# Patient Record
Sex: Male | Born: 1945 | Race: White | Hispanic: No | Marital: Married | Smoking: Former smoker
Health system: Southern US, Community
[De-identification: ages and names within clinical notes are randomized; demographics above are authoritative.]

## PROBLEM LIST (undated history)

## (undated) DIAGNOSIS — E7800 Pure hypercholesterolemia, unspecified: Secondary | ICD-10-CM

## (undated) DIAGNOSIS — R7303 Prediabetes: Secondary | ICD-10-CM

## (undated) DIAGNOSIS — J449 Chronic obstructive pulmonary disease, unspecified: Secondary | ICD-10-CM

## (undated) DIAGNOSIS — H269 Unspecified cataract: Secondary | ICD-10-CM

## (undated) DIAGNOSIS — R3911 Hesitancy of micturition: Secondary | ICD-10-CM

## (undated) DIAGNOSIS — J309 Allergic rhinitis, unspecified: Secondary | ICD-10-CM

## (undated) DIAGNOSIS — D490 Neoplasm of unspecified behavior of digestive system: Secondary | ICD-10-CM

## (undated) DIAGNOSIS — N201 Calculus of ureter: Secondary | ICD-10-CM

## (undated) DIAGNOSIS — K5732 Diverticulitis of large intestine without perforation or abscess without bleeding: Secondary | ICD-10-CM

## (undated) HISTORY — DX: Hesitancy of micturition: R39.11

## (undated) HISTORY — DX: Prediabetes: R73.03

## (undated) HISTORY — PX: TONSILLECTOMY: SUR1361

## (undated) HISTORY — DX: Calculus of ureter: N20.1

## (undated) HISTORY — DX: Unspecified cataract: H26.9

## (undated) HISTORY — DX: Allergic rhinitis, unspecified: J30.9

## (undated) HISTORY — PX: CHOLECYSTECTOMY: SHX55

## (undated) HISTORY — DX: Diverticulitis of large intestine without perforation or abscess without bleeding: K57.32

## (undated) HISTORY — DX: Chronic obstructive pulmonary disease, unspecified: J44.9

## (undated) HISTORY — PX: CATARACT EXTRACTION: SUR2

## (undated) HISTORY — DX: Neoplasm of unspecified behavior of digestive system: D49.0

## (undated) HISTORY — DX: Pure hypercholesterolemia, unspecified: E78.00

---

## 2012-06-14 ENCOUNTER — Ambulatory Visit (INDEPENDENT_AMBULATORY_CARE_PROVIDER_SITE_OTHER): Payer: BC Managed Care – PPO | Admitting: Emergency Medicine

## 2012-06-14 ENCOUNTER — Ambulatory Visit: Payer: BC Managed Care – PPO

## 2012-06-14 VITALS — BP 135/83 | HR 89 | Temp 98.0°F | Resp 18 | Ht 70.0 in | Wt 257.0 lb

## 2012-06-14 DIAGNOSIS — F17200 Nicotine dependence, unspecified, uncomplicated: Secondary | ICD-10-CM

## 2012-06-14 DIAGNOSIS — J3489 Other specified disorders of nose and nasal sinuses: Secondary | ICD-10-CM

## 2012-06-14 DIAGNOSIS — R05 Cough: Secondary | ICD-10-CM

## 2012-06-14 DIAGNOSIS — Z720 Tobacco use: Secondary | ICD-10-CM

## 2012-06-14 DIAGNOSIS — J029 Acute pharyngitis, unspecified: Secondary | ICD-10-CM

## 2012-06-14 LAB — POCT CBC
Granulocyte percent: 80.7 %G — AB (ref 37–80)
MID (cbc): 0.7 (ref 0–0.9)
MPV: 8.8 fL (ref 0–99.8)
POC Granulocyte: 9.8 — AB (ref 2–6.9)
POC LYMPH PERCENT: 13.4 %L (ref 10–50)
POC MID %: 5.9 %M (ref 0–12)
Platelet Count, POC: 277 10*3/uL (ref 142–424)
RBC: 5.41 M/uL (ref 4.69–6.13)
RDW, POC: 14.1 %

## 2012-06-14 IMAGING — CR DG CHEST 2V
2 series · 2 of 2 positions shown · non-contrast
Comparison: None.

CLINICAL DATA: Cough

CHEST - 2 VIEW

[PA]
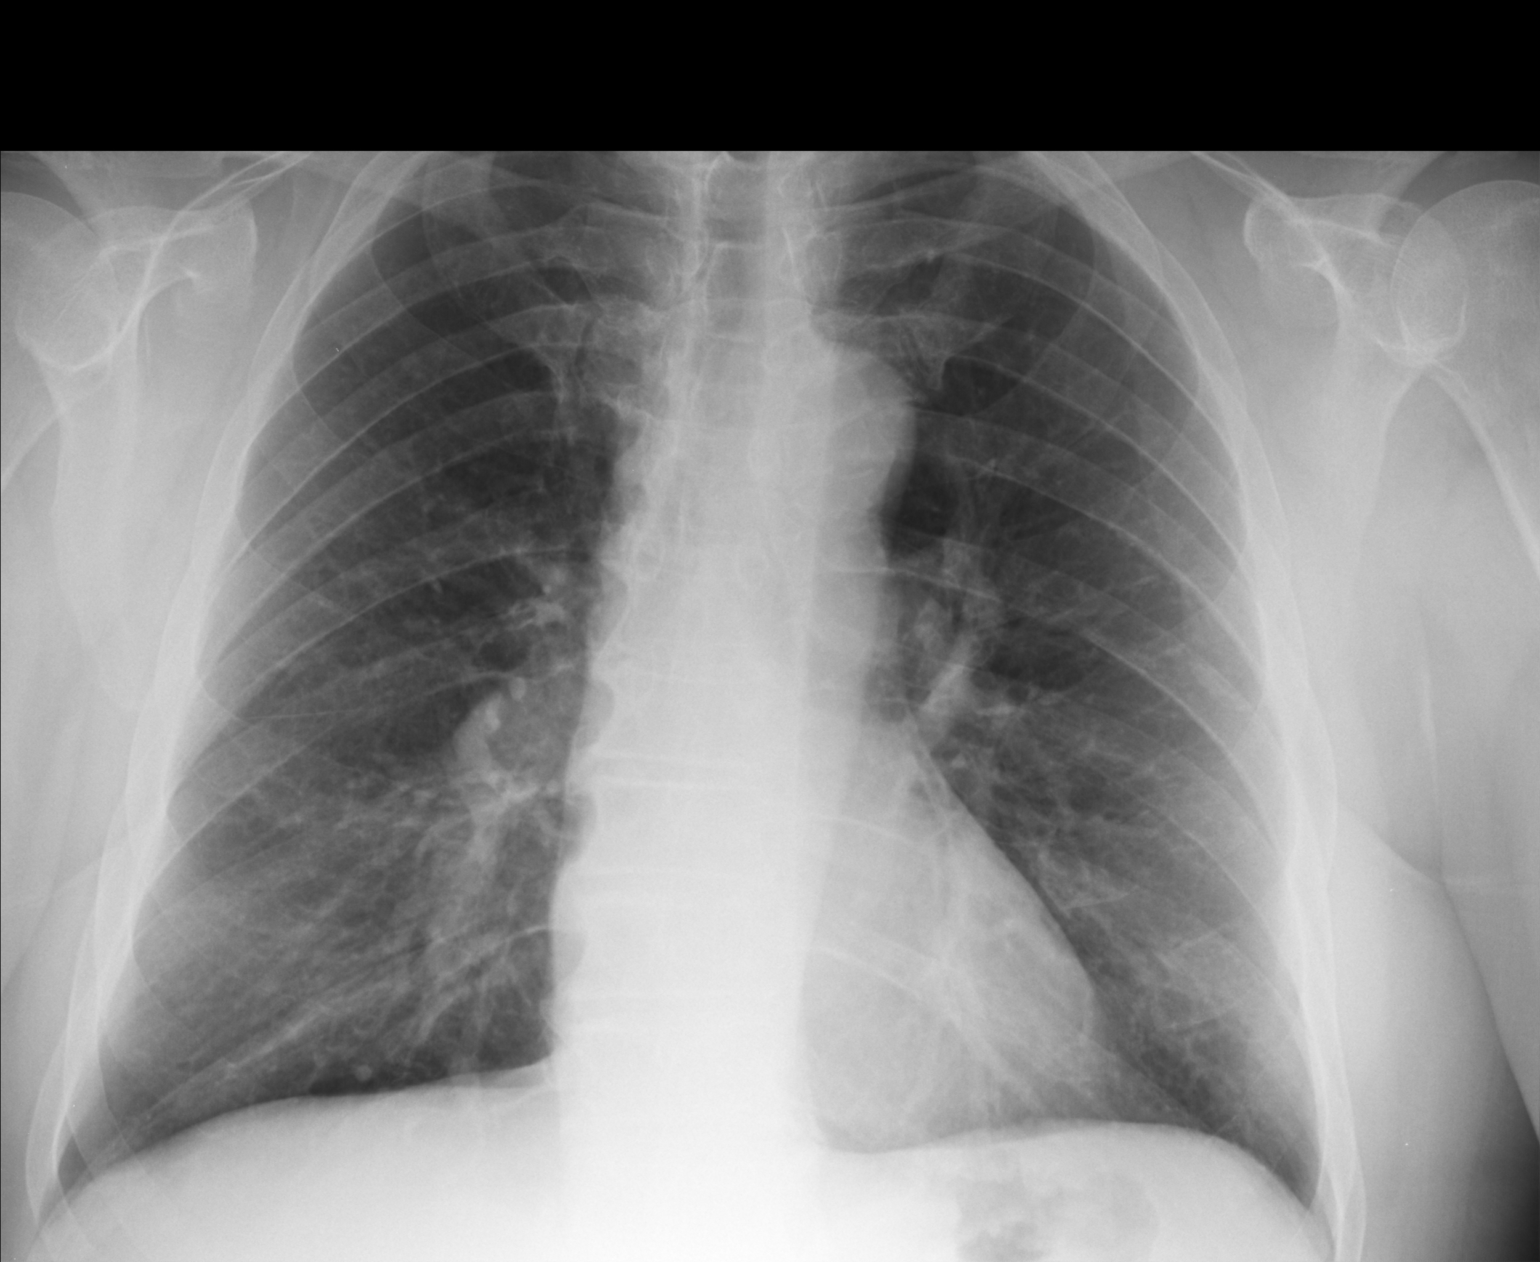

[lateral]
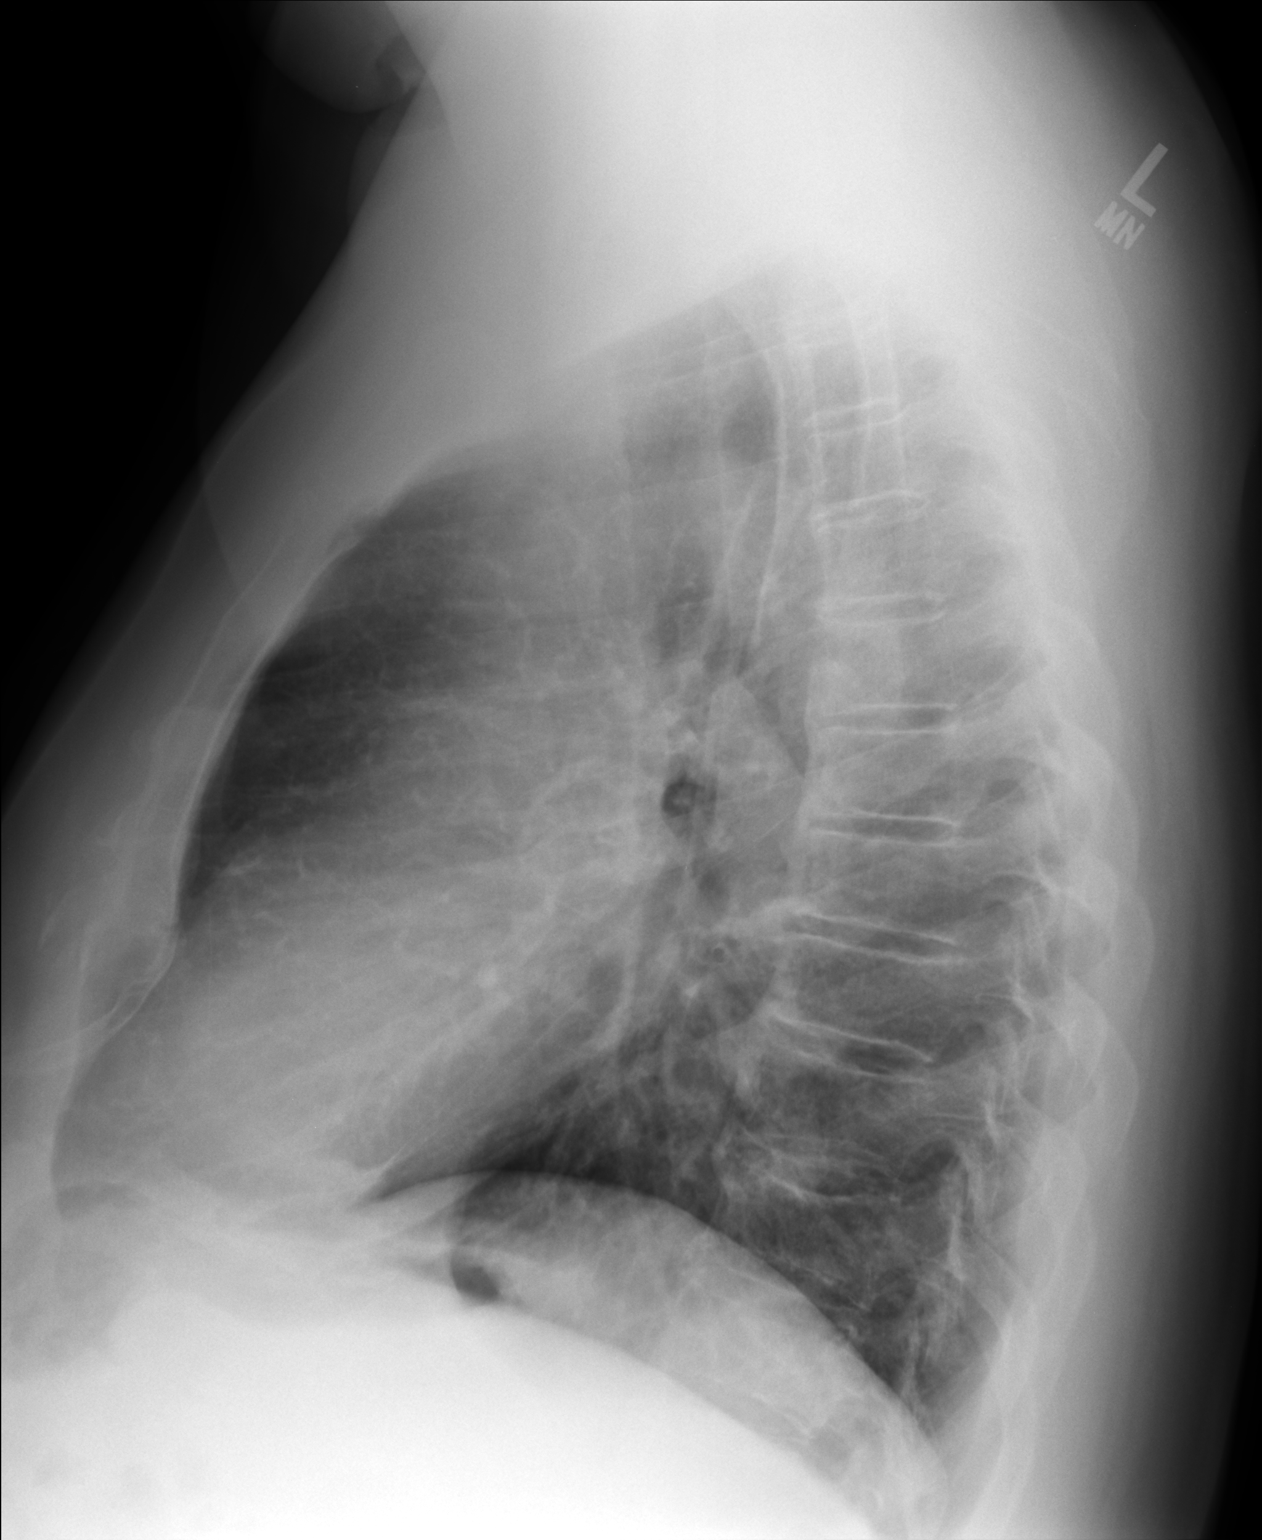

[2 of 2 positions shown; findings below may reference images not displayed]

FINDINGS: Lungs are essentially clear. No pleural effusion or
pneumothorax.

Cardiomediastinal silhouette is within normal limits.

Degenerative changes of the visualized thoracolumbar spine.
IMPRESSION: No evidence of acute cardiopulmonary disease.

## 2012-06-14 MED ORDER — HYDROCOD POLST-CHLORPHEN POLST 10-8 MG/5ML PO LQCR: 5.0000 mL | Freq: Two times a day (BID) | ORAL | Status: DC | PRN

## 2012-06-14 MED ORDER — GUAIFENESIN ER 1200 MG PO TB12: 1.0000 | ORAL_TABLET | Freq: Two times a day (BID) | ORAL | Status: DC | PRN

## 2012-06-14 MED ORDER — LEVOFLOXACIN 500 MG PO TABS: 500.0000 mg | ORAL_TABLET | Freq: Every day | ORAL | Status: DC

## 2012-06-14 NOTE — Patient Instructions (Addendum)
Take all of the Levaquin as prescribed.  Use Mucinex twice daily.  Use Tussionex for cough at night if needed (do not use this during the day as it may make you sleepy).  Use an over the counter allergy medicine (Claritin/Allegra/Zyrtec - your choice!) for runny nose and post-nasal drainage.  If you are worsening or not improving, come back in.

## 2012-06-14 NOTE — Progress Notes (Signed)
Subjective:    Patient ID: Paul Ibarra, male    DOB: 1945/09/21, 66 y.o.   MRN: [id number]  HPI  Paul Ibarra is a 66 yr old male with approximately 1 month of cough productive of "tons of mucus".  He works at the [employer] and went to a doctor there who gave him an antibiotic for bronchitis several weeks ago.  He does not remember what medication he was given, but states he finished the full course.  He states his symptoms are not going away.  He has now developed some sore throat and rhinorrhea.  He denies fever or chills.  His appetite is normal.  No nausea or vomiting.  Endorses some diarrhea that he attributes to Mucinex.  Has been treating symptoms with Mucinex, Dayquil, and Nyquil with little relief.  He currently smokes about 1ppd and has done so since he was 66 yrs old.        Review of Systems  Constitutional: Negative for fever, chills, appetite change and unexpected weight change.  HENT: Positive for congestion, sore throat and rhinorrhea. Negative for ear pain.   Respiratory: Positive for cough, shortness of breath and wheezing.   Cardiovascular: Negative.   Gastrointestinal: Negative.   Musculoskeletal: Positive for arthralgias (bilateral knees).  Skin: Negative.   Neurological: Negative for dizziness, syncope, light-headedness and headaches.  All other systems reviewed and are negative.       Objective:   Physical Exam  Vitals reviewed. Constitutional: He is oriented to person, place, and time. He appears well-developed and well-nourished. No distress.  HENT:  Head: Normocephalic and atraumatic.  Right Ear: Tympanic membrane and ear canal normal.  Left Ear: Tympanic membrane and ear canal normal.  Nose: Nose normal. Right sinus exhibits no maxillary sinus tenderness and no frontal sinus tenderness. Left sinus exhibits no maxillary sinus tenderness and no frontal sinus tenderness.  Mouth/Throat: Uvula is midline, oropharynx is clear and moist and mucous membranes are  normal.  Neck: Neck supple.  Cardiovascular: Normal rate, regular rhythm, normal heart sounds and intact distal pulses.   Pulmonary/Chest: No accessory muscle usage. Not tachypneic. No respiratory distress. He has no decreased breath sounds. He has wheezes (throughout). He has rhonchi in the right middle field and the left middle field. He has no rales.       Upper airway congestion as well  Lymphadenopathy:    He has no cervical adenopathy.  Neurological: He is alert and oriented to person, place, and time.  Skin: Skin is warm and dry.  Psychiatric: He has a normal mood and affect. His behavior is normal.     Filed Vitals:   06/14/12 1813  BP: 135/83  Pulse: 89  Temp: 98 F (36.7 C)  Resp: 18     Results for orders placed in visit on 06/14/12  POCT CBC      Component Value Range   WBC 12.1 (*) 4.6 - 10.2 K/uL   Lymph, poc 1.6  0.6 - 3.4   POC LYMPH PERCENT 13.4  10 - 50 %L   MID (cbc) 0.7  0 - 0.9   POC MID % 5.9  0 - 12 %M   POC Granulocyte 9.8 (*) 2 - 6.9   Granulocyte percent 80.7 (*) 37 - 80 %G   RBC 5.41  4.69 - 6.13 M/uL   Hemoglobin 15.9  14.1 - 18.1 g/dL   HCT, POC 13.0  86.5 - 53.7 %   MCV 95.9  80 - 97 fL  MCH, POC 29.4  27 - 31.2 pg   MCHC 30.6 (*) 31.8 - 35.4 g/dL   RDW, POC 16.1     Platelet Count, POC 277  142 - 424 K/uL   MPV 8.8  0 - 99.8 fL     [facility] reading (PRIMARY) by  Dr. Dareen Piano - no evidence of pneumonia       Assessment & Plan:   1. Cough  POCT CBC, DG Chest 2 View, levofloxacin (LEVAQUIN) 500 MG tablet, Guaifenesin (MUCINEX MAXIMUM STRENGTH) 1200 MG TB12, chlorpheniramine-HYDROcodone (TUSSIONEX PENNKINETIC ER) 10-8 MG/5ML LQCR  2. Tobacco use  DG Chest 2 View  3. Rhinorrhea    4. Sore throat      Paul Ibarra is a 66 yr old male with one month history of productive cough.  Suspect that this is an acute exacerbation of chronic bronchitis given his smoking history.  White count is slightly elevated today, but he is afebrile.  CXR shows  no evidence of a pneumonia.  He does not remember the name of his previous antibiotic, but suspect that it was azithromycin.  Will treat with 7 days of Levaquin.  Encouraged continuation of Mucinex BID and OTC allergy medication for relief of rhinorrhea/post-nasal drainage.  Tussionex for cough at night if needed.  Discussed RTC precautions.  Briefly discussed smoking cessation.

## 2012-09-22 NOTE — Progress Notes (Signed)
Reviewed and agree.

## 2013-08-17 ENCOUNTER — Ambulatory Visit: Payer: BC Managed Care – PPO

## 2013-08-17 ENCOUNTER — Ambulatory Visit (INDEPENDENT_AMBULATORY_CARE_PROVIDER_SITE_OTHER): Payer: BC Managed Care – PPO | Admitting: Family Medicine

## 2013-08-17 ENCOUNTER — Other Ambulatory Visit: Payer: Self-pay | Admitting: Family Medicine

## 2013-08-17 VITALS — BP 120/80 | HR 86 | Temp 98.2°F | Resp 18 | Ht 69.75 in | Wt 253.0 lb

## 2013-08-17 DIAGNOSIS — R05 Cough: Secondary | ICD-10-CM

## 2013-08-17 DIAGNOSIS — R059 Cough, unspecified: Secondary | ICD-10-CM

## 2013-08-17 DIAGNOSIS — E669 Obesity, unspecified: Secondary | ICD-10-CM | POA: Insufficient documentation

## 2013-08-17 DIAGNOSIS — R062 Wheezing: Secondary | ICD-10-CM

## 2013-08-17 DIAGNOSIS — R0602 Shortness of breath: Secondary | ICD-10-CM

## 2013-08-17 DIAGNOSIS — Z720 Tobacco use: Secondary | ICD-10-CM | POA: Insufficient documentation

## 2013-08-17 DIAGNOSIS — F17200 Nicotine dependence, unspecified, uncomplicated: Secondary | ICD-10-CM

## 2013-08-17 IMAGING — CR DG CHEST 2V
2 series · 2 of 2 positions shown · non-contrast
Comparison: [DATE]

CLINICAL DATA: Wheezing and shortness of breath.

EXAM:
CHEST  2 VIEW

[lateral]
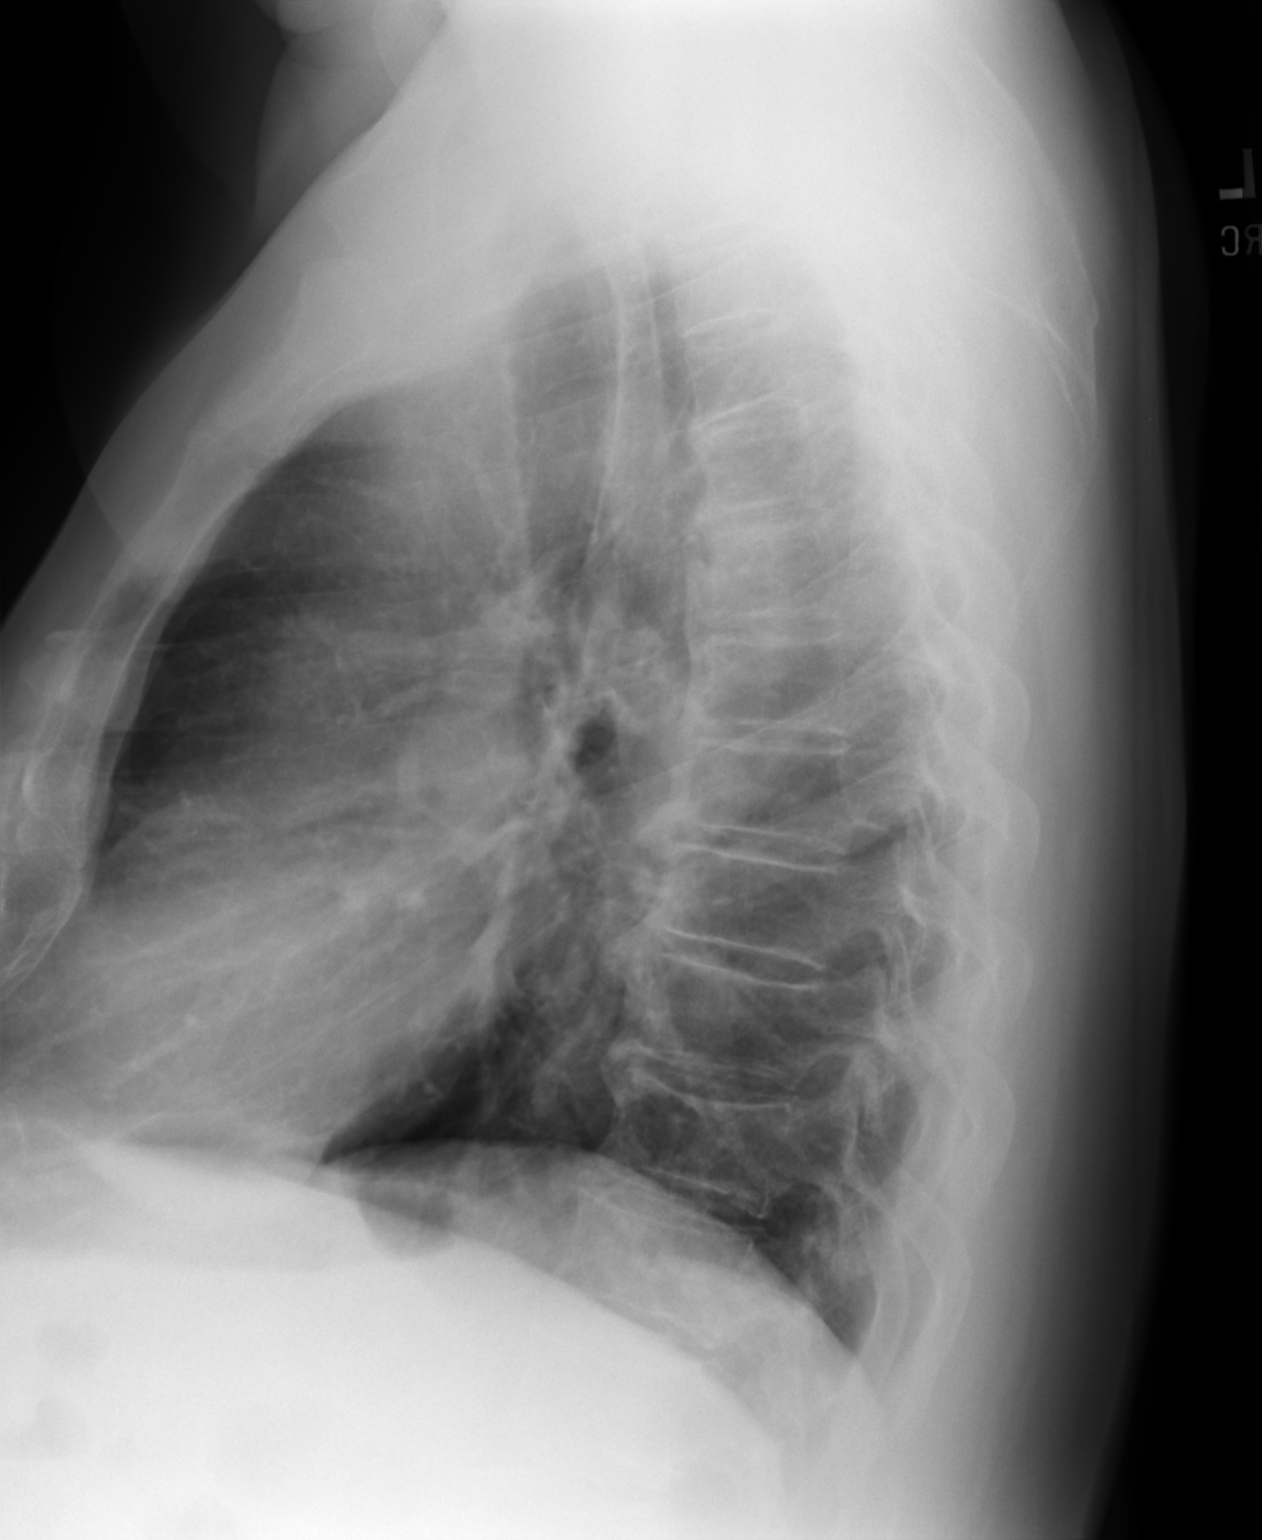

[PA]
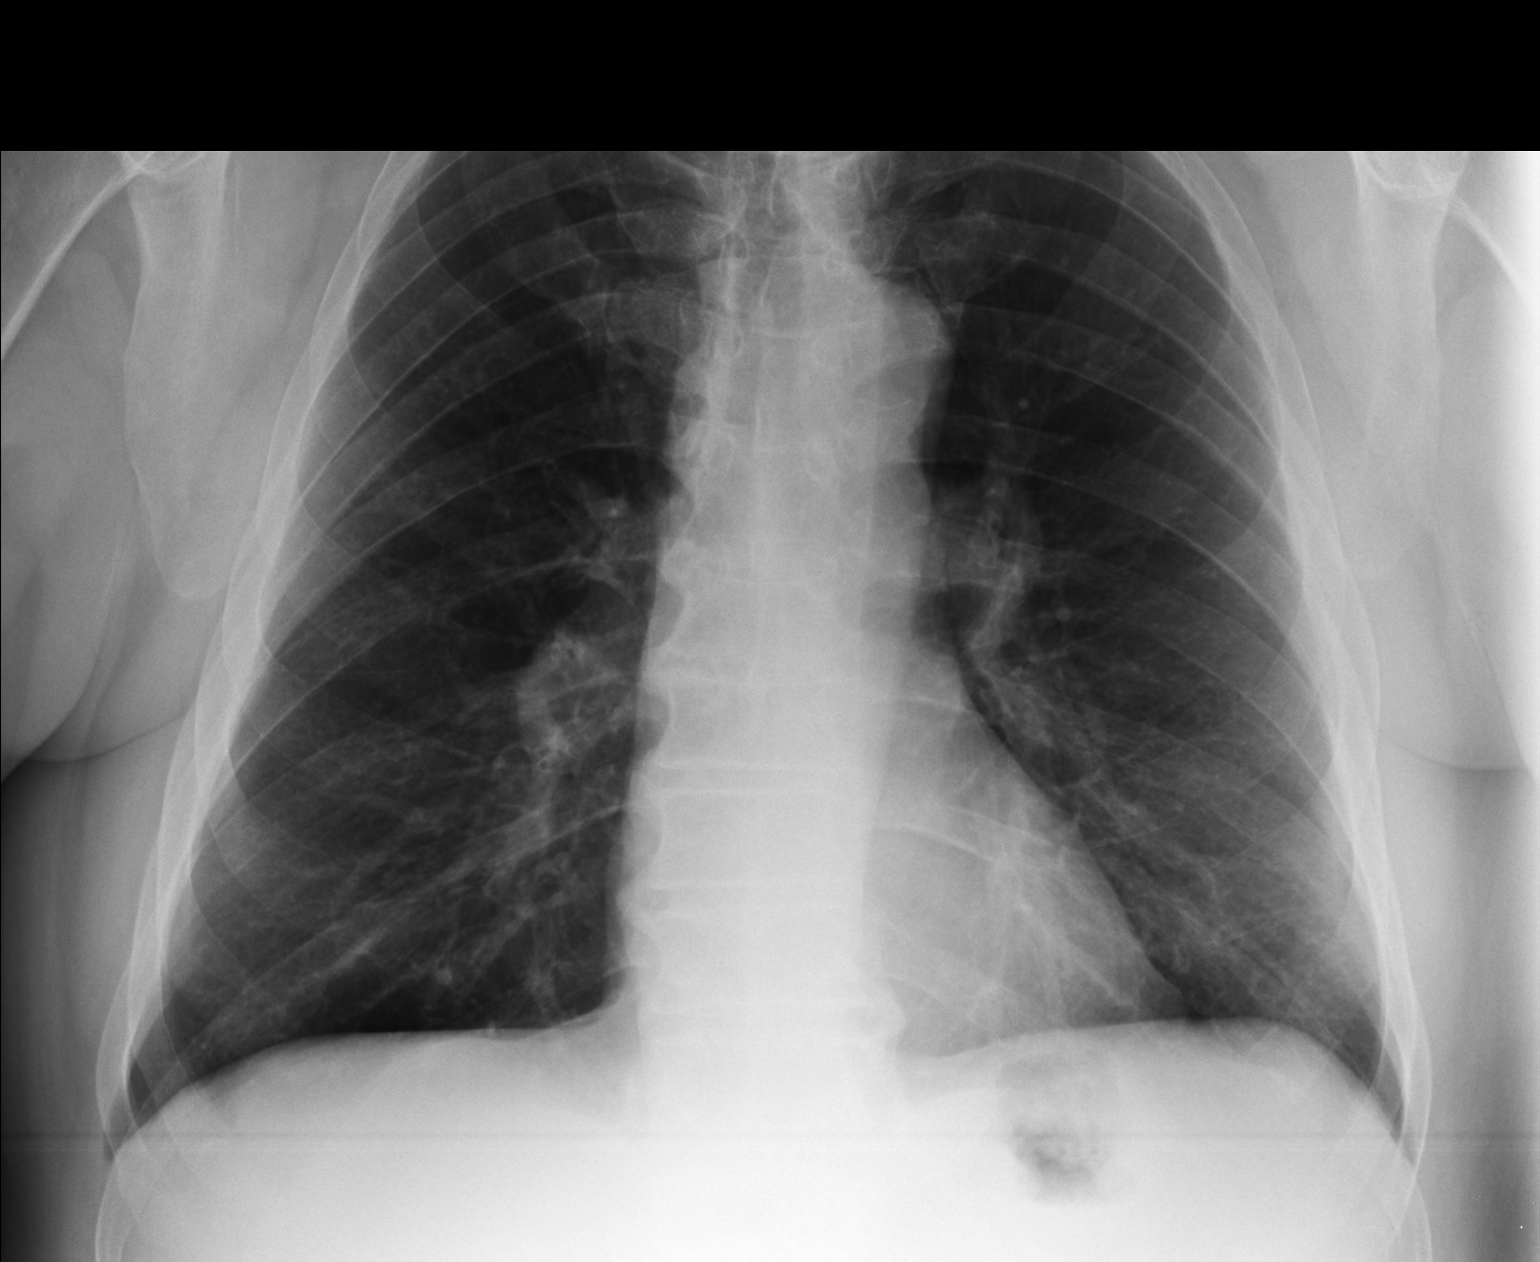

[2 of 2 positions shown; findings below may reference images not displayed]

FINDINGS: Normal heart size. No pleural effusion or edema. No airspace
consolidation identified. Chronic bronchitic changes are noted
bilaterally. Review of the visualized osseous structures is
unremarkable.
IMPRESSION: 1. Bronchitic changes.
2. No acute cardiopulmonary abnormality.

## 2013-08-17 MED ORDER — BENZONATATE 100 MG PO CAPS: 100.0000 mg | ORAL_CAPSULE | Freq: Three times a day (TID) | ORAL | Status: DC | PRN

## 2013-08-17 MED ORDER — ALBUTEROL SULFATE (2.5 MG/3ML) 0.083% IN NEBU
2.5000 mg | INHALATION_SOLUTION | Freq: Once | RESPIRATORY_TRACT | Status: AC
  Administered 2013-08-17: 2.5 mg via RESPIRATORY_TRACT

## 2013-08-17 MED ORDER — ALBUTEROL SULFATE HFA 108 (90 BASE) MCG/ACT IN AERS
2.0000 | INHALATION_SPRAY | Freq: Four times a day (QID) | RESPIRATORY_TRACT | Status: DC | PRN
Start: 2013-08-17 — End: 2015-10-19

## 2013-08-17 MED ORDER — DOXYCYCLINE HYCLATE 100 MG PO CAPS: 100.0000 mg | ORAL_CAPSULE | Freq: Two times a day (BID) | ORAL | Status: DC

## 2013-08-17 MED ORDER — PREDNISONE 20 MG PO TABS: ORAL_TABLET | ORAL | Status: DC

## 2013-08-17 NOTE — Progress Notes (Signed)
[facility] [address] [place] [address] [phone]  Date:  08/17/2013   Name:  Paul Ibarra   DOB:  [birth year]   MRN:  [id number]  PCP:  No primary provider on file.    Chief Complaint: Cough and Shortness of Breath   History of Present Illness:  Paul Ibarra is a 68 y.o. very pleasant male patient who presents with the following:  Here today because of SOB that he noted yesterday.   He notes that a couple of months ago he had a lot of nasal congestion and cough.  He saw his PCP and was treated with levaquin and mucinex D; he thinks he was given this treatment in early December.  He got better but he continues to have "mucus from his sinuses."    Yesterday he noted chest tightness and wheezing. It got quite cold yesterday. 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  He is also coughing again- this seemed to get worse yesterday.    Admits that he vomited on Tuesday evening after over- eating; he vomited a lot of mucus.  Otherwise he has not had abdominal pain or GI symptoms.   He has not noted a fever  He is taking OTC claritin and some sort of OTC cough medication.    He has not definitely been told that he has COPD- he does have albuterol at home.  He used it yesterday; it did not seem to help much.   He saw pulmonary a couple of years ago.  Per his report he had spirometry at that time that looked ok  He has been smoking a PPD for about 50 years.   Otherwise he has a history of nail fungus, no cardiac problems that he is aware of No CP  There are no active problems to display for this patient.   History reviewed. No pertinent past medical history.  Past Surgical History  Procedure Laterality Date  . Cholecystectomy      History  Substance Use Topics  . Smoking status: Current Every Day Smoker -- 1.00 packs/day for 50 years  . Smokeless tobacco: Not on file  . Alcohol Use: Yes     History reviewed. No pertinent family history.  Allergies  Allergen Reactions  . Sulfa Antibiotics     Medication list has been reviewed and updated.  No current outpatient prescriptions on file prior to visit.   No current facility-administered medications on file prior to visit.    Review of Systems:  As per HPI- otherwise negative.   Physical Examination: Filed Vitals:   08/17/13 1050  BP: 120/80  Pulse: 86  Temp: 98.2 F (36.8 C)  Resp: 18   Filed Vitals:   08/17/13 1050  Height: 5' 9.75" (1.772 m)  Weight: 253 lb (114.76 kg)   Body mass index is 36.55 kg/(m^2). Ideal Body Weight: Weight in (lb) to have BMI = 25: 172.6  GEN: WDWN, NAD, Non-toxic, A & O x 3, obese, has appearance of long- term heavy smoker HEENT: Atraumatic, Normocephalic. Neck supple. No masses, No LAD.  Bilateral TM wnl, oropharynx normal.  PEERL,EOMI.   Ears and Nose: No external deformity. CV: RRR, No M/G/R. No JVD. No thrill. No extra heart sounds. PULM: CTA B, no wheezes, crackles, rhonchi. No retractions. No resp. distress. No accessory muscle use. ABD: S, NT, ND. No rebound. No HSM. EXTR: No c/c/e NEURO  Normal gait.  PSYCH: Normally interactive. Conversant. Not depressed or anxious appearing.  Calm demeanor.   Given albuterol neb: sat to 93% and wheezing somewhat better  [facility] reading (PRIMARY) by  Dr. Lorelei Pont. CXR: likely COPD.  Borderline lung size.   Suspect increased marking left lower lobe is breast shadow   CHEST 2 VIEW  COMPARISON: 06/14/2012  FINDINGS: Normal heart size. No pleural effusion or edema. No airspace consolidation identified. Chronic bronchitic changes are noted bilaterally. Review of the visualized osseous structures is unremarkable.  IMPRESSION: 1. Bronchitic changes. 2. No acute cardiopulmonary abnormality.  Assessment and Plan: SOB (shortness of breath) - Plan: albuterol (PROVENTIL) (2.5 MG/3ML) 0.083% nebulizer solution 2.5 mg, doxycycline  (VIBRAMYCIN) 100 MG capsule, predniSONE (DELTASONE) 20 MG tablet  Wheezing - Plan: DG Chest 2 View, albuterol (PROVENTIL) (2.5 MG/3ML) 0.083% nebulizer solution 2.5 mg, albuterol (PROVENTIL HFA;VENTOLIN HFA) 108 (90 BASE) MCG/ACT inhaler  Tobacco abuse  Cough - Plan: benzonatate (TESSALON) 100 MG capsule  Likely COPD exacerbation.  Will treat with prednisone, doxycycline, refilled albuterol, tessalon perles.   Encouraged him to have re- evaluation for COPD when he is well; a medication like spiriva might improve his quality of life.  If not getting better he is to let me know- Sooner if worse.      Signed Lamar Blinks, MD

## 2013-08-17 NOTE — Patient Instructions (Addendum)
Use the prednisone and doxycycline as directed. Use the albuterol as needed for your wheezing.  If you are not better in the next few days please let me know- Sooner if worse.   Use the tessalon perles as needed for cough.    Once you are well, you might want to have spirometry testing to evaluate your lung function. I suspect that you likely have COPD, and a separate inhaler that you use every day may improve your breathing and exercise tolerance   Please think about quitting smoking!

## 2013-08-21 ENCOUNTER — Other Ambulatory Visit: Payer: Self-pay | Admitting: *Deleted

## 2013-08-21 NOTE — Telephone Encounter (Signed)
Opened refill encounter in error.

## 2015-10-19 ENCOUNTER — Ambulatory Visit (INDEPENDENT_AMBULATORY_CARE_PROVIDER_SITE_OTHER): Payer: BLUE CROSS/BLUE SHIELD | Admitting: Physician Assistant

## 2015-10-19 ENCOUNTER — Ambulatory Visit (INDEPENDENT_AMBULATORY_CARE_PROVIDER_SITE_OTHER): Payer: BLUE CROSS/BLUE SHIELD

## 2015-10-19 VITALS — BP 112/80 | HR 109 | Temp 99.5°F | Resp 17 | Ht 70.0 in | Wt 263.4 lb

## 2015-10-19 DIAGNOSIS — R058 Other specified cough: Secondary | ICD-10-CM

## 2015-10-19 DIAGNOSIS — R05 Cough: Secondary | ICD-10-CM

## 2015-10-19 DIAGNOSIS — R0602 Shortness of breath: Secondary | ICD-10-CM | POA: Diagnosis not present

## 2015-10-19 DIAGNOSIS — R062 Wheezing: Secondary | ICD-10-CM

## 2015-10-19 IMAGING — CR DG CHEST 2V
2 series · 2 of 2 positions shown · non-contrast
Comparison: [DATE]

CLINICAL DATA: Cough 2-3 days, hoarseness

EXAM:
CHEST  2 VIEW

[PA]
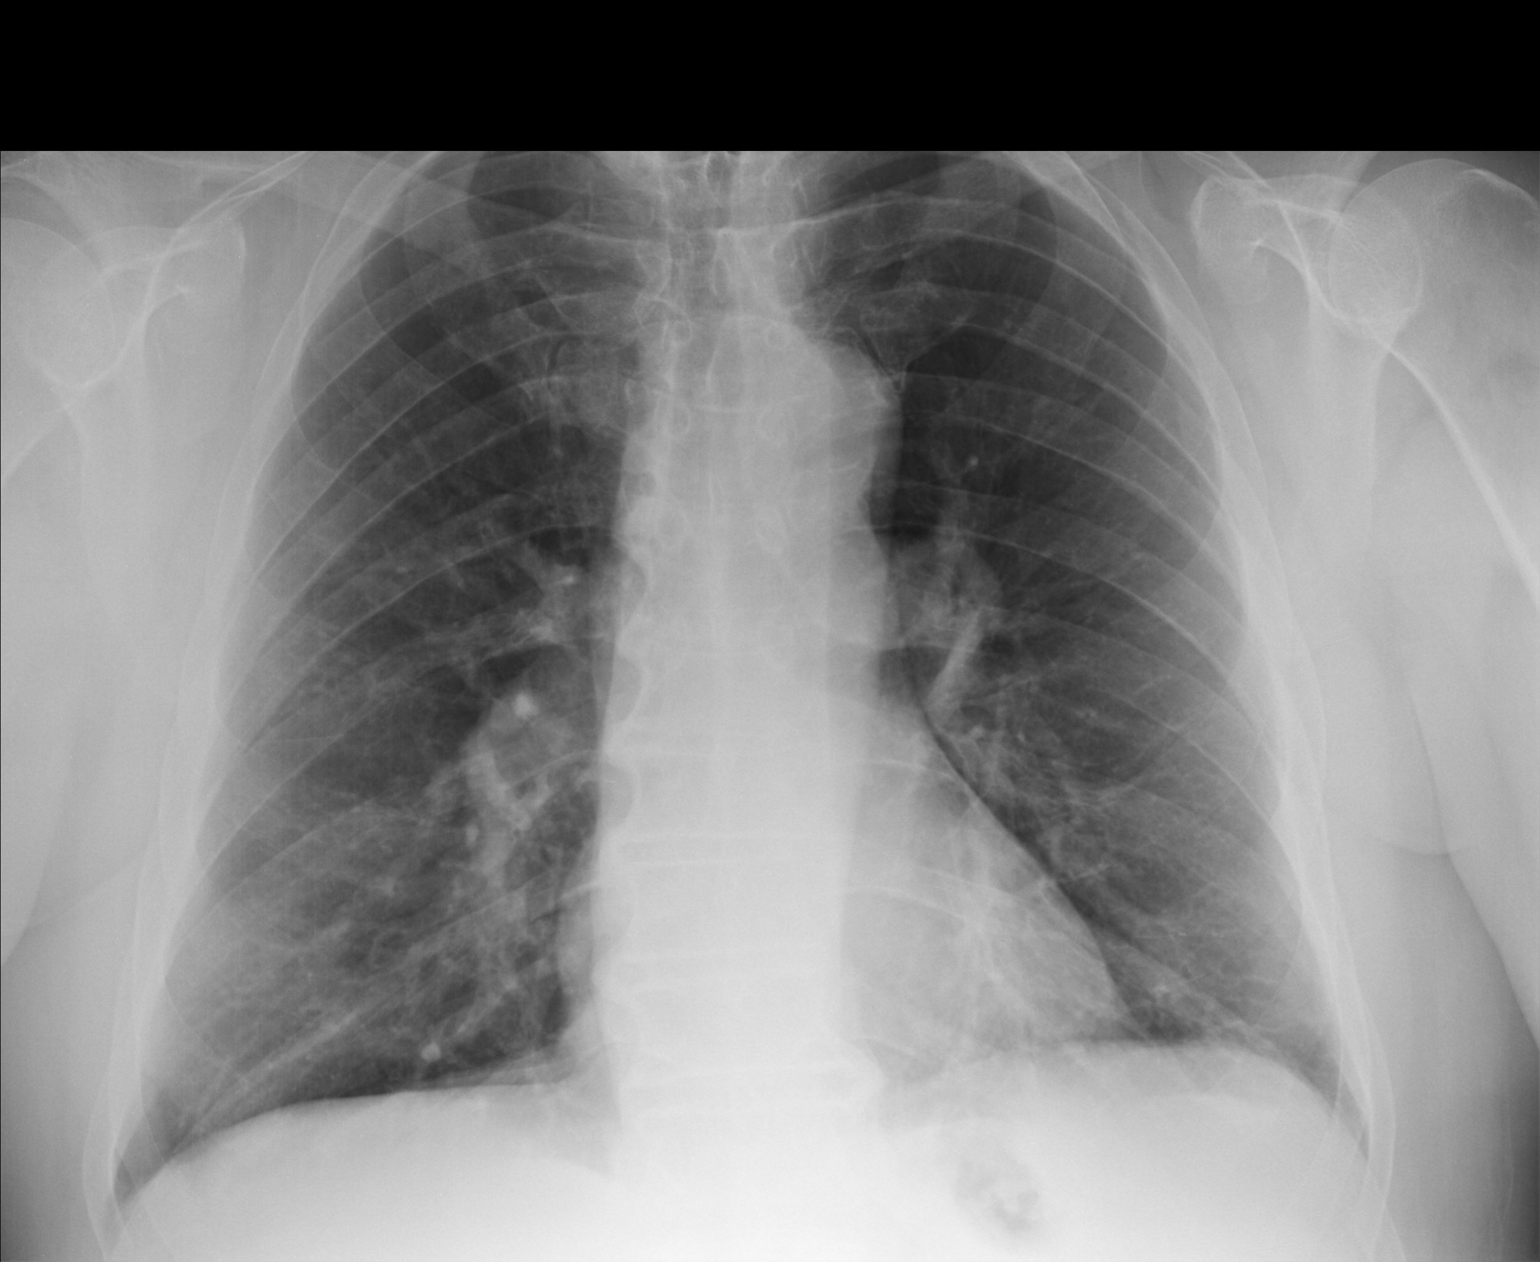

[lateral]
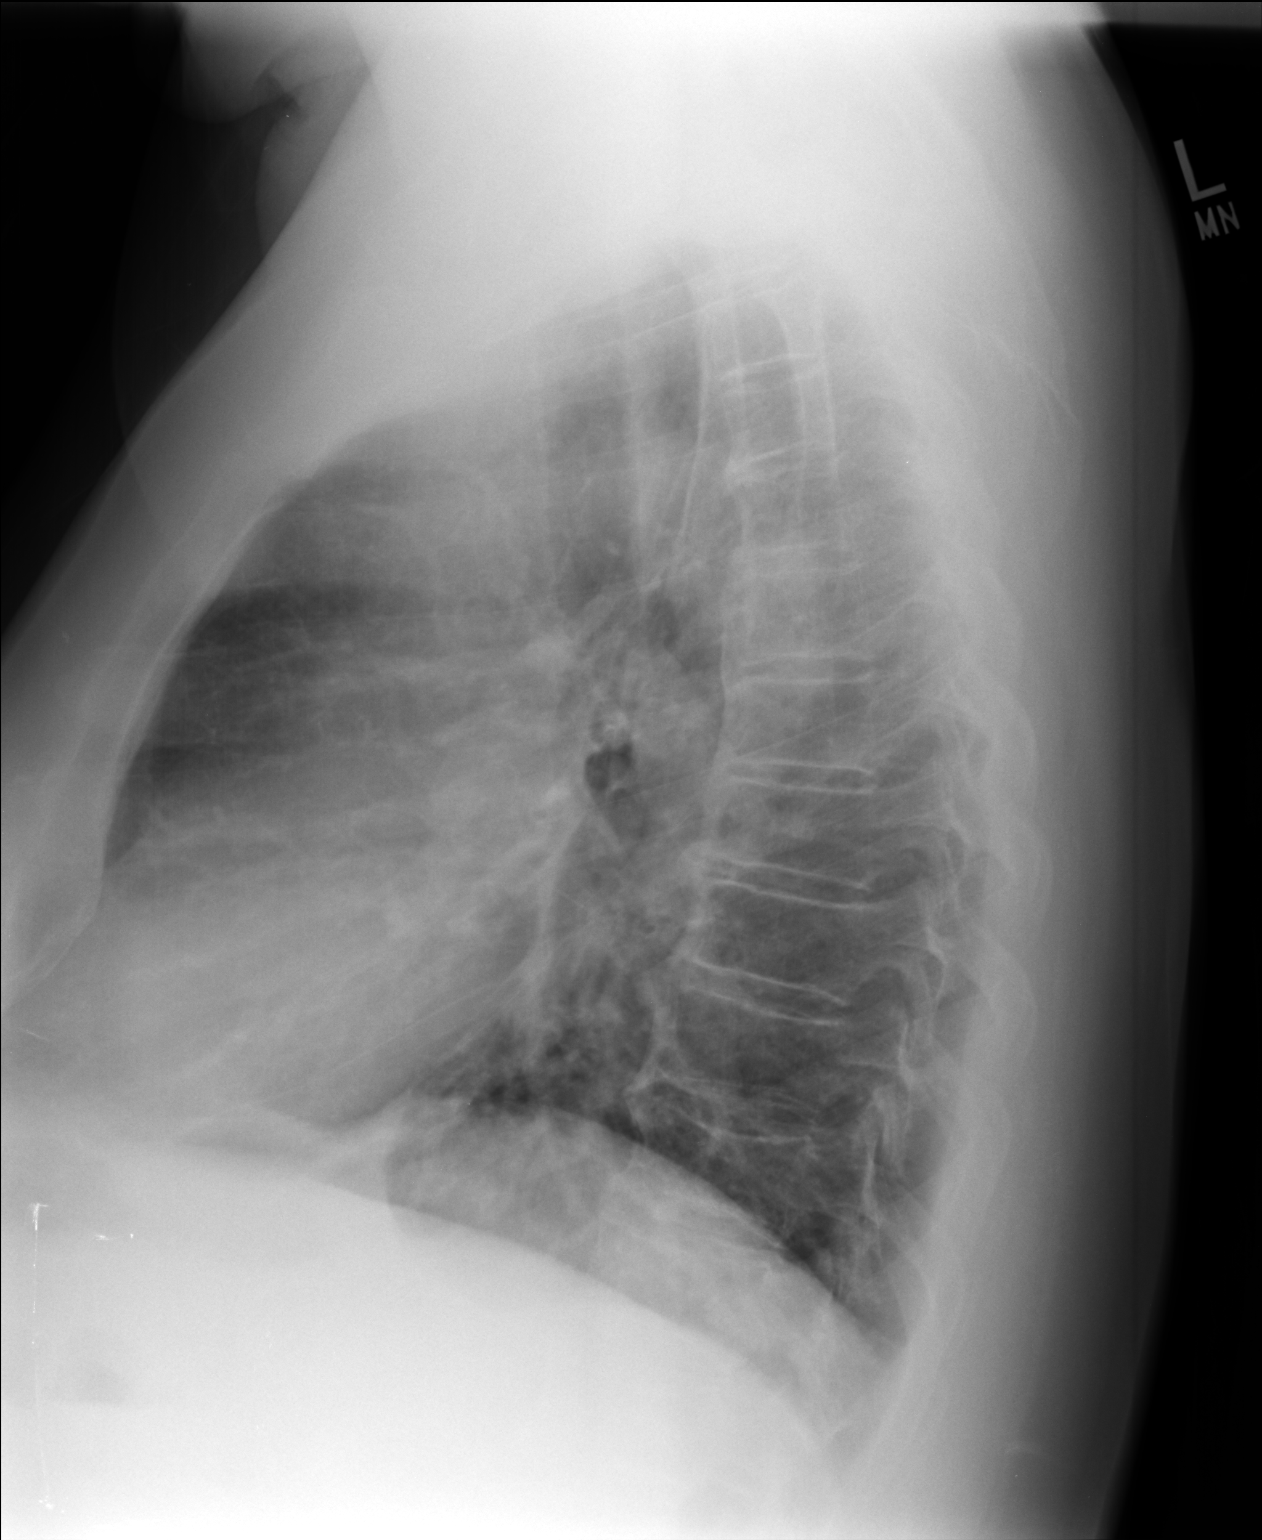

[2 of 2 positions shown; findings below may reference images not displayed]

FINDINGS: Lungs are essentially clear.  No pleural effusion or pneumothorax.

The heart is normal in size.

Degenerative changes of the visualized thoracolumbar spine.
IMPRESSION: No evidence of acute cardiopulmonary disease.

## 2015-10-19 MED ORDER — PREDNISONE 20 MG PO TABS: ORAL_TABLET | ORAL | Status: DC

## 2015-10-19 MED ORDER — BENZONATATE 100 MG PO CAPS: 100.0000 mg | ORAL_CAPSULE | Freq: Three times a day (TID) | ORAL | Status: DC | PRN

## 2015-10-19 MED ORDER — DOXYCYCLINE HYCLATE 100 MG PO CAPS: 100.0000 mg | ORAL_CAPSULE | Freq: Two times a day (BID) | ORAL | Status: DC

## 2015-10-19 MED ORDER — ALBUTEROL SULFATE HFA 108 (90 BASE) MCG/ACT IN AERS: 2.0000 | INHALATION_SPRAY | Freq: Four times a day (QID) | RESPIRATORY_TRACT | Status: DC | PRN

## 2015-10-19 MED ORDER — ALBUTEROL SULFATE (2.5 MG/3ML) 0.083% IN NEBU
2.5000 mg | INHALATION_SOLUTION | Freq: Once | RESPIRATORY_TRACT | Status: AC
  Administered 2015-10-19: 2.5 mg via RESPIRATORY_TRACT

## 2015-10-19 MED ORDER — IPRATROPIUM BROMIDE 0.03 % NA SOLN: 2.0000 | Freq: Two times a day (BID) | NASAL | Status: DC

## 2015-10-19 MED ORDER — IPRATROPIUM BROMIDE 0.02 % IN SOLN
0.5000 mg | Freq: Once | RESPIRATORY_TRACT | Status: AC
  Administered 2015-10-19: 0.5 mg via RESPIRATORY_TRACT

## 2015-10-19 NOTE — Patient Instructions (Addendum)
   IF you received an x-ray today, you will receive an invoice from [facility]. Please contact [facility] at [phone] with questions or concerns regarding your invoice.   IF you received labwork today, you will receive an invoice from [facility]. Please contact [facility] at [phone] with questions or concerns regarding your invoice.   Our billing staff will not be able to assist you with questions regarding bills from these companies.  You will be contacted with the lab results as soon as they are available. The fastest way to get your results is to activate your My Chart account. Instructions are located on the last page of this paperwork. If you have not heard from us regarding the results in 2 weeks, please contact this office.    Acute Bronchitis Bronchitis is inflammation of the airways that extend from the windpipe into the lungs (bronchi). The inflammation often causes mucus to develop. This leads to a cough, which is the most common symptom of bronchitis.  In acute bronchitis, the condition usually develops suddenly and goes away over time, usually in a couple weeks. Smoking, allergies, and asthma can make bronchitis worse. Repeated episodes of bronchitis may cause further lung problems.  CAUSES Acute bronchitis is most often caused by the same virus that causes a cold. The virus can spread from person to person (contagious) through coughing, sneezing, and touching contaminated objects. SIGNS AND SYMPTOMS   Cough.   Fever.   Coughing up mucus.   Body aches.   Chest congestion.   Chills.   Shortness of breath.   Sore throat.  DIAGNOSIS  Acute bronchitis is usually diagnosed through a physical exam. Your health care provider will also ask you questions about your medical history. Tests, such as chest X-rays, are sometimes done to rule out other conditions.  TREATMENT  Acute bronchitis usually goes away in a  couple weeks. Oftentimes, no medical treatment is necessary. Medicines are sometimes given for relief of fever or cough. Antibiotic medicines are usually not needed but may be prescribed in certain situations. In some cases, an inhaler may be recommended to help reduce shortness of breath and control the cough. A cool mist vaporizer may also be used to help thin bronchial secretions and make it easier to clear the chest.  HOME CARE INSTRUCTIONS  Get plenty of rest.   Drink enough fluids to keep your urine clear or pale yellow (unless you have a medical condition that requires fluid restriction). Increasing fluids may help thin your respiratory secretions (sputum) and reduce chest congestion, and it will prevent dehydration.   Take medicines only as directed by your health care provider.  If you were prescribed an antibiotic medicine, finish it all even if you start to feel better.  Avoid smoking and secondhand smoke. Exposure to cigarette smoke or irritating chemicals will make bronchitis worse. If you are a smoker, consider using nicotine gum or skin patches to help control withdrawal symptoms. Quitting smoking will help your lungs heal faster.   Reduce the chances of another bout of acute bronchitis by washing your hands frequently, avoiding people with cold symptoms, and trying not to touch your hands to your mouth, nose, or eyes.   Keep all follow-up visits as directed by your health care provider.  SEEK MEDICAL CARE IF: Your symptoms do not improve after 1 week of treatment.  SEEK IMMEDIATE MEDICAL CARE IF:  You develop an increased fever or chills.   You have chest pain.     You have severe shortness of breath.  You have bloody sputum.   You develop dehydration.  You faint or repeatedly feel like you are going to pass out.  You develop repeated vomiting.  You develop a severe headache. MAKE SURE YOU:   Understand these instructions.  Will watch your  condition.  Will get help right away if you are not doing well or get worse.   This information is not intended to replace advice given to you by your health care provider. Make sure you discuss any questions you have with your health care provider.   Document Released: 09/03/2004 Document Revised: 08/17/2014 Document Reviewed: 01/17/2013 Elsevier Interactive Patient Education 2016 [employer]  

## 2015-10-19 NOTE — Progress Notes (Signed)
Patient ID: Paul Ibarra, male    DOB: [birth year], 70 y.o.   MRN: [id number]  PCP: No primary care provider on file.  Subjective:   Chief Complaint  Patient presents with  . Cough    since yesterday  . Sore Throat    HPI The patient is a 70 year old obese white male, former smoker, who presents today with x3 days of sore throat, nasal congestion, rhinorrhea, chest tightness with a clear productive cough. He endorses increased fatigue, headaches associated with cough and lower right rib pain. Denies lymph node enlargement, denies ear fullness or pressure, denies eye pain or tearing.   Review of Systems Constitutional: Positive for fatigue. Negative for fever, chills, activity change and unexpected weight change.  HENT: Positive for congestion, sinus pressure and sore throat. Negative for ear discharge, ear pain, postnasal drip and trouble swallowing.  Eyes: Negative for pain, discharge and itching.  Respiratory: Positive for cough, chest tightness, shortness of breath and wheezing.  Cardiovascular: Negative for chest pain and palpitations.     Patient Active Problem List   Diagnosis Date Noted  . Obesity, unspecified 08/17/2013  . Tobacco abuse 08/17/2013     Prior to Admission medications   Medication Sig Start Date End Date Taking? Authorizing Provider  albuterol (PROVENTIL HFA;VENTOLIN HFA) 108 (90 BASE) MCG/ACT inhaler Inhale 2 puffs into the lungs every 6 (six) hours as needed for wheezing or shortness of breath. 08/17/13  Yes Gay Filler Copland, MD  fluticasone (FLONASE) 50 MCG/ACT nasal spray Place 2 sprays into both nostrils daily.   Yes Historical Provider, MD  Pseudoephedrine-APAP-DM (DAYQUIL PO) Take by mouth.   Yes Historical Provider, MD     Allergies  Allergen Reactions  . Sulfa Antibiotics        Objective:  Physical Exam  Constitutional: He is oriented to person, place, and time. He appears well-developed and well-nourished. He is active and  cooperative. No distress.  BP 112/80 mmHg  Pulse 109  Temp(Src) 99.5 F (37.5 C) (Oral)  Resp 17  Ht 5\' 10"  (1.778 m)  Wt 263 lb 6 oz (119.466 kg)  BMI 37.79 kg/m2  SpO2 93%  HENT:  Head: Normocephalic and atraumatic.  Right Ear: Hearing normal.  Left Ear: Hearing normal.  Nose: Mucosal edema (with mild erythema) and rhinorrhea present.  Eyes: Conjunctivae are normal. No scleral icterus.  Neck: Normal range of motion. Neck supple. No thyromegaly present.  Cardiovascular: Normal rate, regular rhythm and normal heart sounds.   Pulses:      Radial pulses are 2+ on the right side, and 2+ on the left side.  Pulmonary/Chest: Effort normal. He has wheezes (soft, high-pitched in both bases). He exhibits no tenderness.  Symptoms unchanged post albuterol + Atrovent neb treatment, but wheezes resolved.  Lymphadenopathy:       Head (right side): No tonsillar, no preauricular, no posterior auricular and no occipital adenopathy present.       Head (left side): No tonsillar, no preauricular, no posterior auricular and no occipital adenopathy present.    He has no cervical adenopathy.       Right: No supraclavicular adenopathy present.       Left: No supraclavicular adenopathy present.  Neurological: He is alert and oriented to person, place, and time. No sensory deficit.  Skin: Skin is warm, dry and intact. No rash noted. No cyanosis or erythema. Nails show no clubbing.  Psychiatric: He has a normal mood and affect. His speech is normal and  behavior is normal.      Dg Chest 2 View  10/19/2015  CLINICAL DATA:  Cough 2-3 days, hoarseness EXAM: CHEST  2 VIEW COMPARISON:  08/17/2013 FINDINGS: Lungs are essentially clear.  No pleural effusion or pneumothorax. The heart is normal in size. Degenerative changes of the visualized thoracolumbar spine. IMPRESSION: No evidence of acute cardiopulmonary disease. Electronically Signed   By: Julian Hy M.D.   On: 10/19/2015 13:03        Assessment &  Plan:   1. Productive cough 2. SOB (shortness of breath) 3. Wheezing Likely bronchitis. Reassuring CXR. Prednisone taper, doxycycline, albuterol inhaler, tessalon perles. - DG Chest 2 View; Future - ipratropium (ATROVENT) 0.03 % nasal spray; Place 2 sprays into both nostrils 2 (two) times daily.  Dispense: 30 mL; Refill: 0 - albuterol (PROVENTIL) (2.5 MG/3ML) 0.083% nebulizer solution 2.5 mg; Take 3 mLs (2.5 mg total) by nebulization once. - ipratropium (ATROVENT) nebulizer solution 0.5 mg; Take 2.5 mLs (0.5 mg total) by nebulization once. - benzonatate (TESSALON) 100 MG capsule; Take 1 capsule (100 mg total) by mouth 3 (three) times daily as needed for cough.  Dispense: 40 capsule; Refill: 0 - doxycycline (VIBRAMYCIN) 100 MG capsule; Take 1 capsule (100 mg total) by mouth 2 (two) times daily.  Dispense: 20 capsule; Refill: 0 - albuterol (PROVENTIL HFA;VENTOLIN HFA) 108 (90 Base) MCG/ACT inhaler; Inhale 2 puffs into the lungs every 6 (six) hours as needed for wheezing or shortness of breath.  Dispense: 1 Inhaler; Refill: 0 - predniSONE (DELTASONE) 20 MG tablet; Take 3 PO QAM x3days, 2 PO QAM x3days, 1 PO QAM x3days  Dispense: 18 tablet; Refill: 0    Fara Chute, PA-C Physician Assistant-Certified [facility]

## 2015-10-19 NOTE — Progress Notes (Signed)
Subjective:     Patient ID: Paul Ibarra, male   DOB: Mar 18, 1946, 70 y.o.   MRN: [id number]  HPI The patient is a 70 year old obese white male, former smoker, who presents today with x3 days of sore throat, nasal congestion, rhinorrhea, chest tightness with a clear productive cough. He endorses increased fatigue, headaches associated with cough and lower right rib pain. Denies  lymph node enlargement, denies ear fullness or pressure, denies eye pain or tearing.  Review of Systems  Constitutional: Positive for fatigue. Negative for fever, chills, activity change and unexpected weight change.  HENT: Positive for congestion, sinus pressure and sore throat. Negative for ear discharge, ear pain, postnasal drip and trouble swallowing.   Eyes: Negative for pain, discharge and itching.  Respiratory: Positive for cough, chest tightness, shortness of breath and wheezing.   Cardiovascular: Negative for chest pain and palpitations.       Objective:   Physical Exam  Constitutional: He appears well-developed and well-nourished. No distress.  HENT:  Head: Normocephalic and atraumatic.  Right Ear: External ear normal.  Left Ear: External ear normal.  Mouth/Throat: Oropharynx is clear and moist. No oropharyngeal exudate.  Mild erythematous inflamed nares and nasal turbinates  Neck: Neck supple.  Mild lymphadenopathy of anterior cervical chain  Cardiovascular: Normal rate, regular rhythm, normal heart sounds and intact distal pulses.  Exam reveals no gallop and no friction rub.   No murmur heard. Pulmonary/Chest: No respiratory distress. He has wheezes. He has no rales. He exhibits tenderness.  Fine wheezes on expiration  Abdominal: Soft. Bowel sounds are normal.  Skin: Skin is warm and dry.   Dg Chest 2 View  10/19/2015  CLINICAL DATA:  Cough 2-3 days, hoarseness EXAM: CHEST  2 VIEW COMPARISON:  08/17/2013 FINDINGS: Lungs are essentially clear.  No pleural effusion or pneumothorax. The heart is  normal in size. Degenerative changes of the visualized thoracolumbar spine. IMPRESSION: No evidence of acute cardiopulmonary disease. Electronically Signed   By: Julian Hy M.D.   On: 10/19/2015 13:03       Assessment:     The patient is a 70 year old white male who presents with chest tightness, productive cough and head congestion X3 days. Order CXR to r/o pneumonia, likely bronchitis viral vs bacterial unknown. Patient is a former smoker, currently using e.cigarettes, will treat symptoms of chest congestion and give nebulizer for airway management.     Plan:     1. Productive cough - DG Chest 2 View; Future - ipratropium (ATROVENT) 0.03 % nasal spray; Place 2 sprays into both nostrils 2 (two) times daily.  Dispense: 30 mL; Refill: 0 - ipratropium (ATROVENT) nebulizer solution 0.5 mg; Take 2.5 mLs (0.5 mg total) by nebulization once. - benzonatate (TESSALON) 100 MG capsule; Take 1 capsule (100 mg total) by mouth 3 (three) times daily as needed for cough.  Dispense: 40 capsule; Refill: 0 - doxycycline (VIBRAMYCIN) 100 MG capsule; Take 1 capsule (100 mg total) by mouth 2 (two) times daily.  Dispense: 20 capsule; Refill: 0 - albuterol (PROVENTIL HFA;VENTOLIN HFA) 108 (90 Base) MCG/ACT inhaler; Inhale 2 puffs into the lungs every 6 (six) hours as needed for wheezing or shortness of breath.  Dispense: 1 Inhaler; Refill: 0   2. SOB (shortness of breath)/ Wheezing - albuterol (PROVENTIL) (2.5 MG/3ML) 0.083% nebulizer solution 2.5 mg; Take 3 mLs (2.5 mg total) by nebulization once. - predniSONE (DELTASONE) 20 MG tablet; Take 3 PO QAM x3days, 2 PO QAM x3days, 1 PO  QAM x3days  Dispense: 18 tablet; Refill: 0  Educated patient on the importance of scheduling follow up visit in a few weeks once patient is  Feeling better to establish lung function, perform PFTS and assess for additional chronic medical conditions.

## 2017-01-07 ENCOUNTER — Other Ambulatory Visit: Payer: Self-pay | Admitting: Family Medicine

## 2017-01-07 DIAGNOSIS — R0600 Dyspnea, unspecified: Secondary | ICD-10-CM

## 2017-01-08 ENCOUNTER — Ambulatory Visit (INDEPENDENT_AMBULATORY_CARE_PROVIDER_SITE_OTHER): Payer: BLUE CROSS/BLUE SHIELD | Admitting: Internal Medicine

## 2017-01-08 DIAGNOSIS — R0600 Dyspnea, unspecified: Secondary | ICD-10-CM

## 2017-01-12 ENCOUNTER — Telehealth: Payer: Self-pay | Admitting: Internal Medicine

## 2017-01-12 NOTE — Telephone Encounter (Signed)
Spoke with [facility] at [address] [employer] office, states that pt is in clinic and advised that he no-show'ed for pft.  No pft in epic to send.  Will close encounter.

## 2017-02-01 NOTE — Progress Notes (Signed)
No PFT completed

## 2019-06-02 ENCOUNTER — Other Ambulatory Visit: Payer: Self-pay | Admitting: Obstetrics and Gynecology

## 2019-06-02 DIAGNOSIS — R3915 Urgency of urination: Secondary | ICD-10-CM

## 2019-06-02 DIAGNOSIS — R141 Gas pain: Secondary | ICD-10-CM

## 2019-06-02 DIAGNOSIS — R1032 Left lower quadrant pain: Secondary | ICD-10-CM

## 2019-06-02 DIAGNOSIS — R143 Flatulence: Secondary | ICD-10-CM

## 2019-06-09 ENCOUNTER — Ambulatory Visit
Admission: RE | Admit: 2019-06-09 | Discharge: 2019-06-09 | Disposition: A | Discharge status: Home / Self Care | Payer: BC Managed Care – PPO | Source: Ambulatory Visit | Attending: Obstetrics and Gynecology | Admitting: Obstetrics and Gynecology

## 2019-06-09 DIAGNOSIS — R3915 Urgency of urination: Secondary | ICD-10-CM

## 2019-06-09 DIAGNOSIS — R1032 Left lower quadrant pain: Secondary | ICD-10-CM

## 2019-06-09 DIAGNOSIS — R141 Gas pain: Secondary | ICD-10-CM

## 2019-06-09 DIAGNOSIS — R142 Eructation: Secondary | ICD-10-CM

## 2019-06-09 IMAGING — CT CT ABD-PELV W/ CM
2 of 5 series · 15 of 46 positions shown, 17 images · IV contrast (iopamidol)
Comparison: None.

CLINICAL DATA: Left lower quadrant abdominal pain.

EXAM:
CT ABDOMEN AND PELVIS WITH CONTRAST
TECHNIQUE: Multidetector CT imaging of the abdomen and pelvis was performed
using the standard protocol following bolus administration of
intravenous contrast.
CONTRAST:  125mL [48] IOPAMIDOL ([48]) INJECTION 61%

[Series 2: abd pelvis 5.00 br40 s3 axial · axial · 0.80mm/px · z∈[+1213,+1643]mm · 12 of 96 slices shown, 14 images]
[im 5/96  soft-tissue]
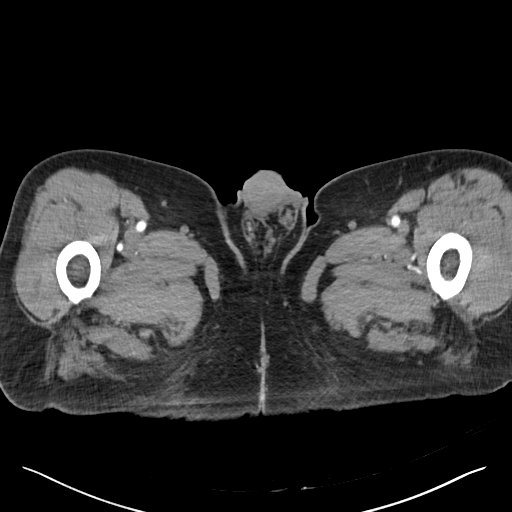
[im 5/96  bone]
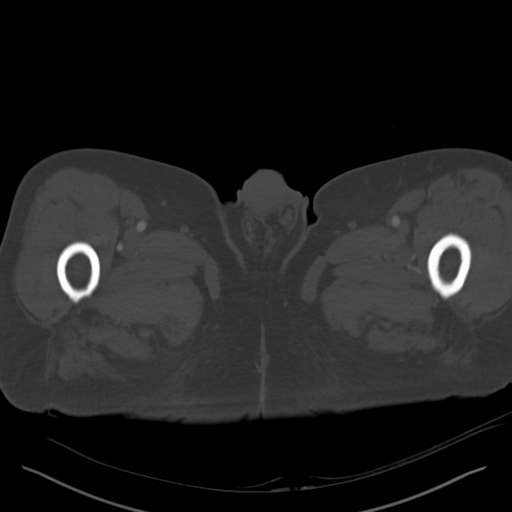
[im 15/96  soft-tissue]
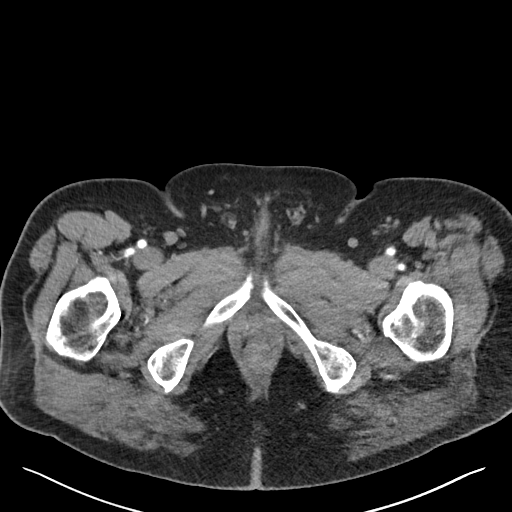
[im 20/96  soft-tissue]
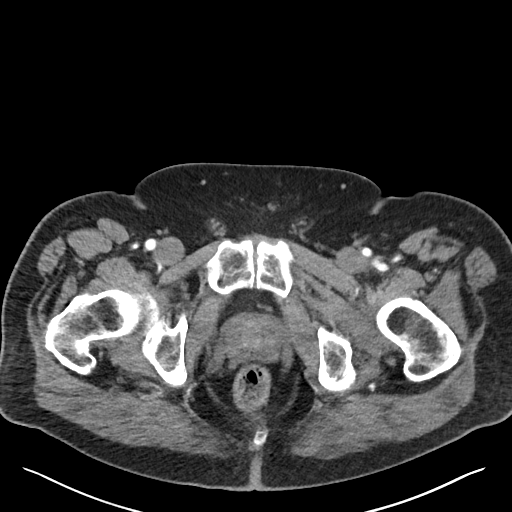
[im 29/96  soft-tissue]
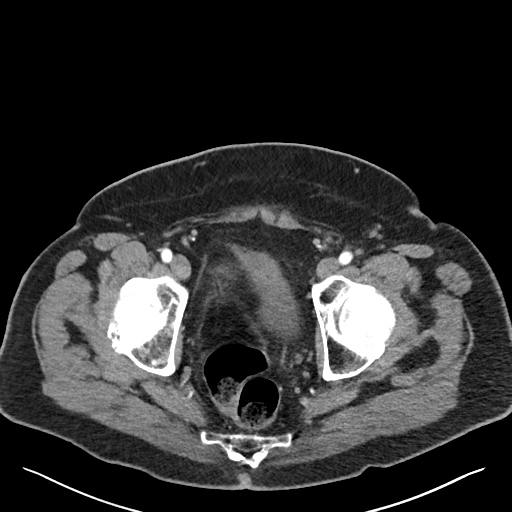
[im 39/96  soft-tissue]
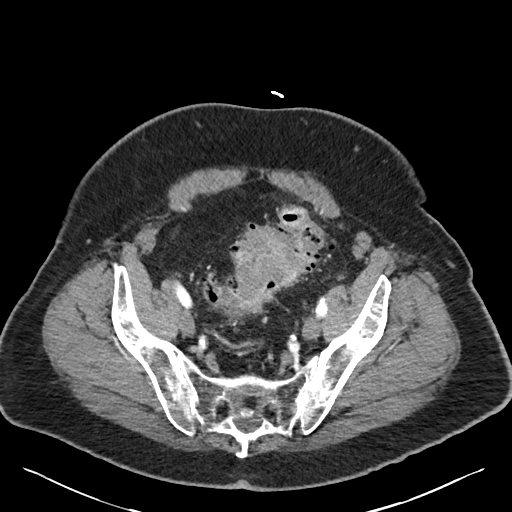
[im 43/96  soft-tissue]
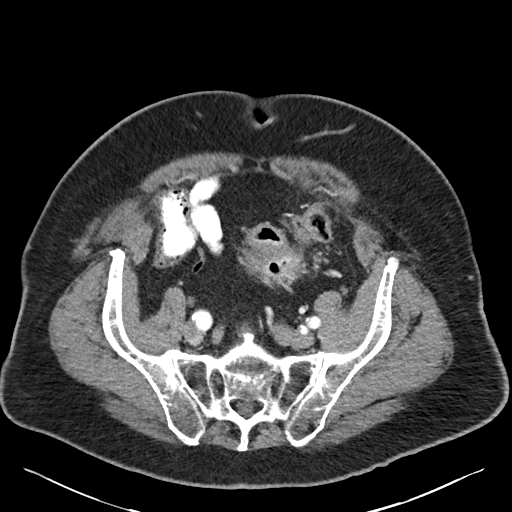
[im 53/96  soft-tissue]
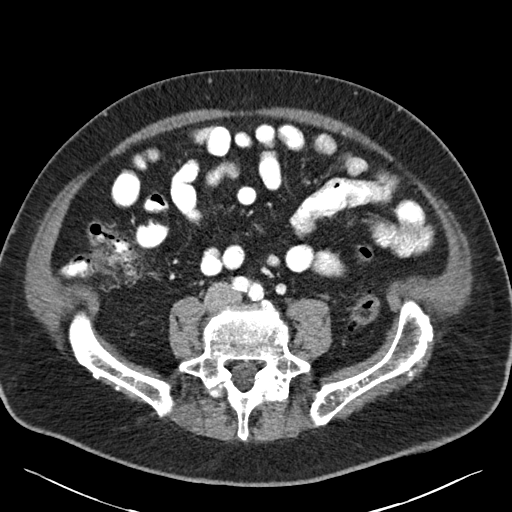
[im 58/96  soft-tissue]
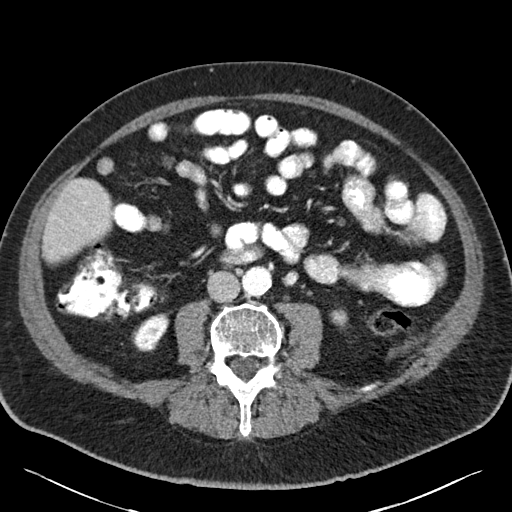
[im 67/96  soft-tissue]
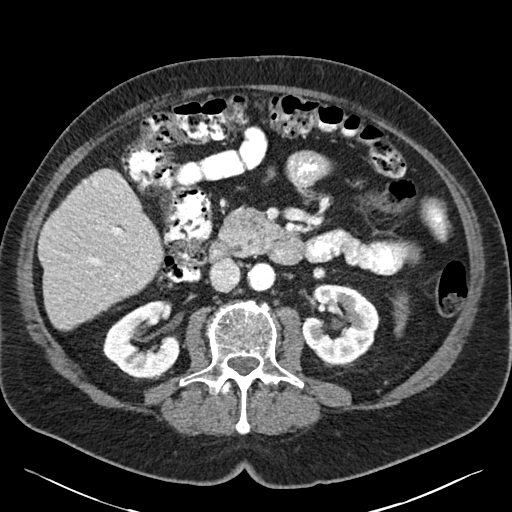
[im 67/96  bone]
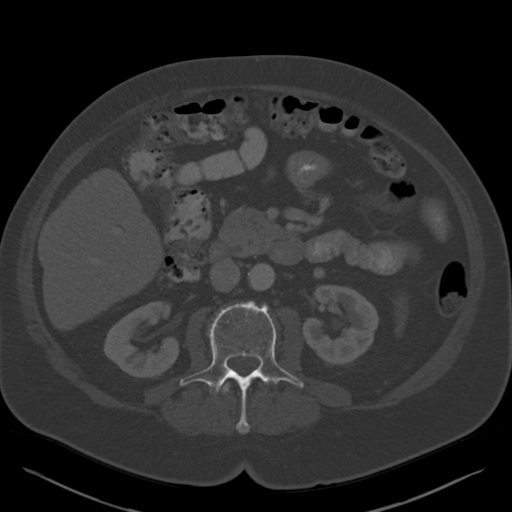
[im 77/96  soft-tissue]
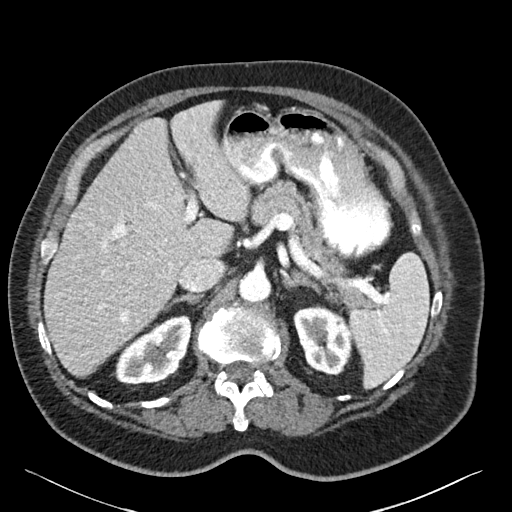
[im 81/96  soft-tissue]
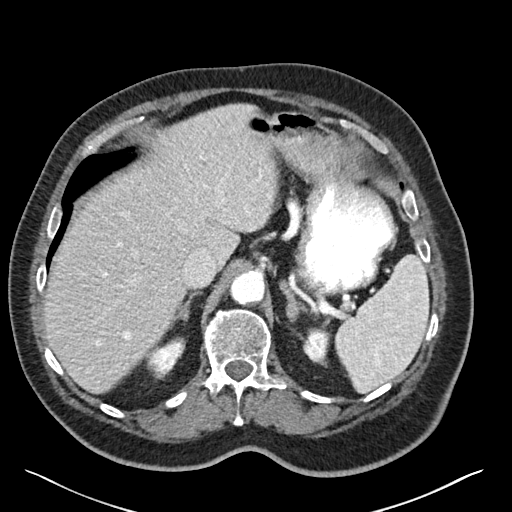
[im 91/96  soft-tissue]
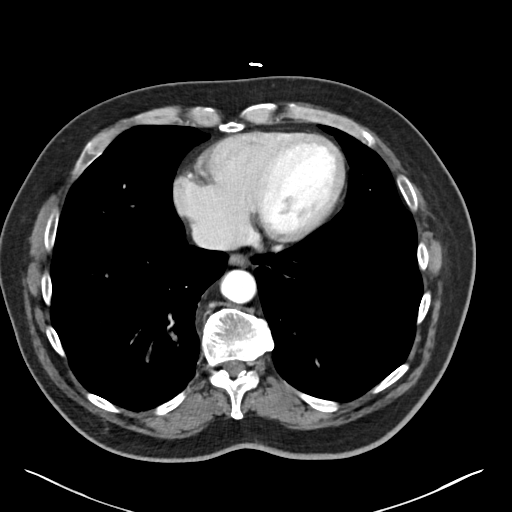

[Series 6: abd pelvis 2.00 br40 s3 cor · coronal · 0.80mm/px · 3 of 191 slices shown]
[im 64/191  soft-tissue]
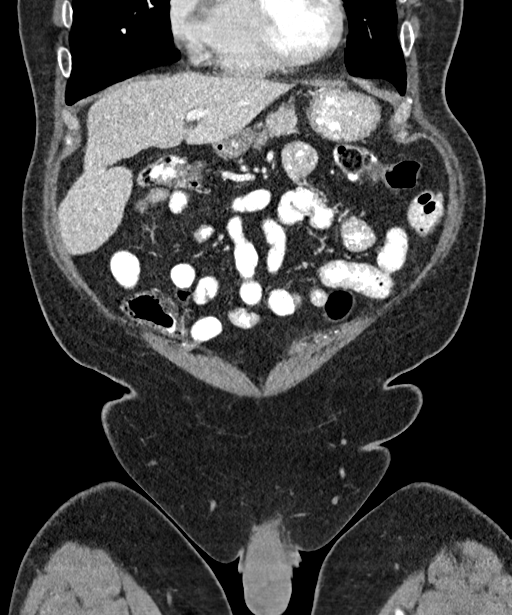
[im 85/191  soft-tissue]
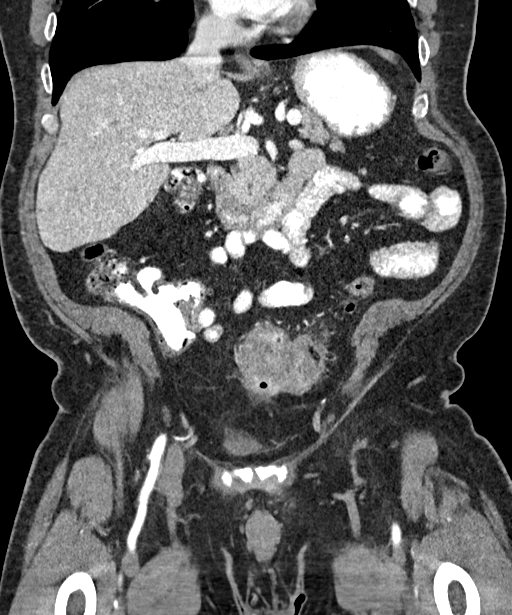
[im 106/191  soft-tissue]
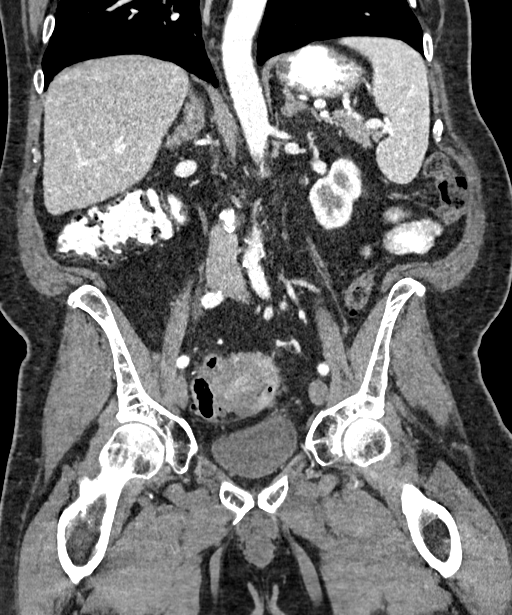

[15 of 46 positions shown; findings below may reference images not displayed]

FINDINGS: Lower chest: Clear lung bases.

Hepatobiliary: No focal liver abnormality is seen. Status post
cholecystectomy. No biliary dilatation.

Pancreas: Cystic pancreatic masses. 1 projects from the inferior
head/uncinate process, measuring 2.2 x 1.3 x 1.2 cm in size. The
other arises at the junction of the body and tail, measuring 1.9 x
1.1 x 1.2 cm. This appears to connect to the main pancreatic duct.
No other pancreatic masses or lesions. No inflammation.

Spleen: Normal in size without focal abnormality.

Adrenals/Urinary Tract: No adrenal masses. Bilateral renal cortical
thinning. Cortical exophytic low-density mass, mid to lower pole,
left kidney, measuring 9 mm consistent with a cyst. Are parenchymal
calcifications in the mid and lower pole of the left kidney. No
other masses. No hydronephrosis. Normal ureters. Bladder is
unremarkable.

Stomach/Bowel: There is wall thickening of the mid sigmoid colon
where there are multiple diverticula mild adjacent inflammatory
stranding. A fluid collection tracks along the anterior wall of this
portion of the sigmoid colon. The fluid containing component which
has nondependent air, measures 3.2 x 1.9 x 2.9 cm. This connects to
a thinner tract of air that extends along the anterior wall the
sigmoid colon for approximately 10 cm in length.

There are additional colonic diverticula with no other areas of
inflammation colon is normal in caliber. Normal stomach and small
bowel. Normal appendix visualized.

Vascular/Lymphatic: Aortic atherosclerosis. No aneurysm. No enlarged
lymph nodes.

Reproductive: Unremarkable.

Other: No ascites no hernia.

Musculoskeletal: Chronic bilateral pars defects at L5-S1 with a
grade 1 anterolisthesis. No acute fractures. No osteoblastic or
osteolytic lesions.
IMPRESSION: 1. Sigmoid colon diverticulitis, with only mild inflammation, but
with an intramural abscess along the anterior wall of the involved
segment of sigmoid colon.
2. No other acute abnormalities.
3. Two pancreatic cystic masses. One at the junction of the body and
tail appear to communicate with the main pancreatic duct. It
measures 1.9 cm in greatest dimension. The other from the inferior
head/uncinate process measures 2.2 cm and is not really communicate
with the main pancreatic duct. Recommend follow-up CT scan yearly
for 5 years and every 2 years for 4 more years. This is based on
consensus guidelines for 0.5-1.9 cm cyst communicating with the main
pancreatic duct.
4. Aortic atherosclerosis.

## 2019-06-09 MED ORDER — IOPAMIDOL (ISOVUE-300) INJECTION 61%
125.0000 mL | Freq: Once | INTRAVENOUS | Status: AC | PRN
  Administered 2019-06-09: 125 mL via INTRAVENOUS

## 2019-06-28 ENCOUNTER — Encounter: Payer: Self-pay | Admitting: Internal Medicine

## 2019-07-21 ENCOUNTER — Ambulatory Visit: Payer: BC Managed Care – PPO | Admitting: Internal Medicine

## 2020-12-20 ENCOUNTER — Other Ambulatory Visit: Payer: Self-pay | Admitting: Obstetrics and Gynecology

## 2020-12-20 DIAGNOSIS — R109 Unspecified abdominal pain: Secondary | ICD-10-CM

## 2021-01-10 ENCOUNTER — Other Ambulatory Visit: Payer: Self-pay

## 2021-01-10 ENCOUNTER — Ambulatory Visit
Admission: RE | Admit: 2021-01-10 | Discharge: 2021-01-10 | Disposition: A | Discharge status: Home / Self Care | Payer: BC Managed Care – PPO | Source: Ambulatory Visit | Attending: Obstetrics and Gynecology | Admitting: Obstetrics and Gynecology

## 2021-01-10 DIAGNOSIS — R109 Unspecified abdominal pain: Secondary | ICD-10-CM

## 2021-01-10 IMAGING — CT CT ABD-PELV W/ CM
1 of 3 series · 13 of 32 positions shown, 18 images · IV contrast (APPLIED)
Comparison: [DATE]

CLINICAL DATA: Abdominal pain.  History of diverticulitis.

EXAM:
CT ABDOMEN AND PELVIS WITH CONTRAST
TECHNIQUE: Multidetector CT imaging of the abdomen and pelvis was performed
using the standard protocol following bolus administration of
intravenous contrast.
CONTRAST:  100mL [K5] IOPAMIDOL ([K5]) INJECTION 61%

[Series 2: abd/pelvis w/cm · axial · 0.86mm/px · z∈[-498,-113]mm · 13 of 89 slices shown, 18 images]
[im 6/89  soft-tissue]
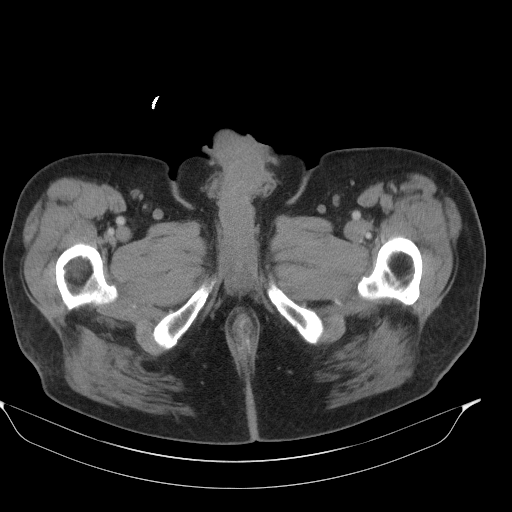
[im 6/89  bone]
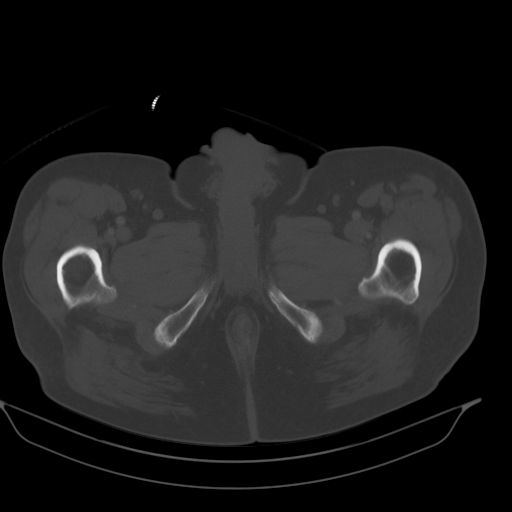
[im 16/89  soft-tissue]
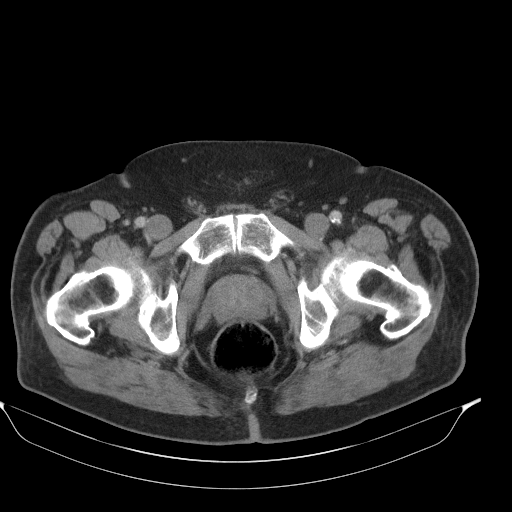
[im 21/89  soft-tissue]
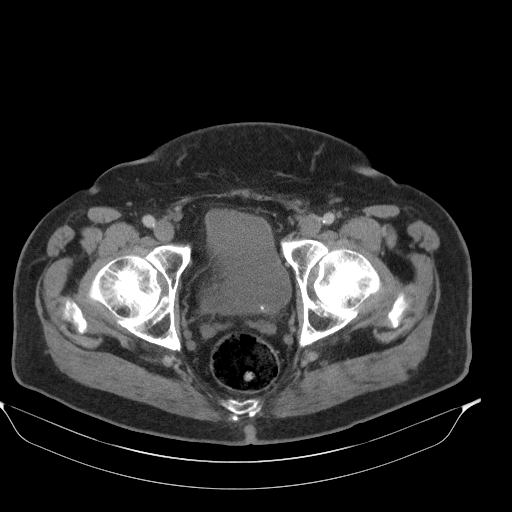
[im 26/89  soft-tissue]
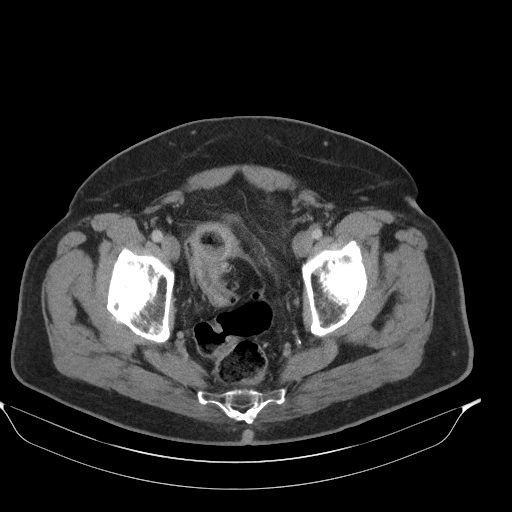
[im 37/89  soft-tissue]
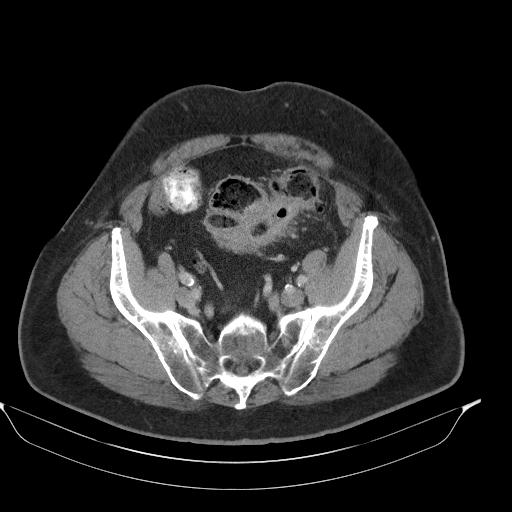
[im 42/89  soft-tissue]
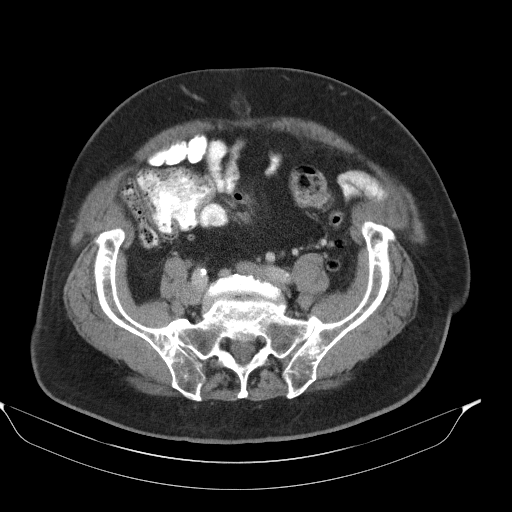
[im 47/89  soft-tissue]
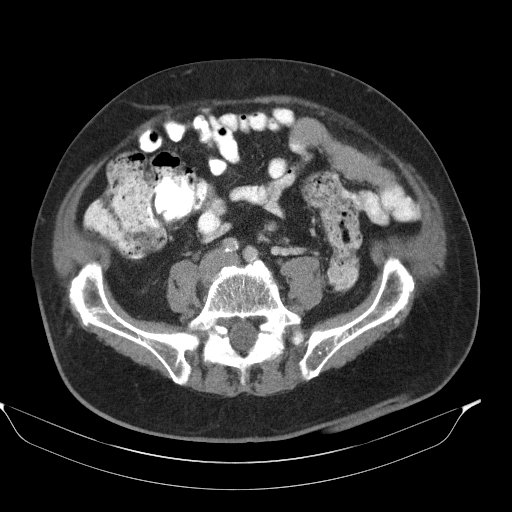
[im 57/89  soft-tissue]
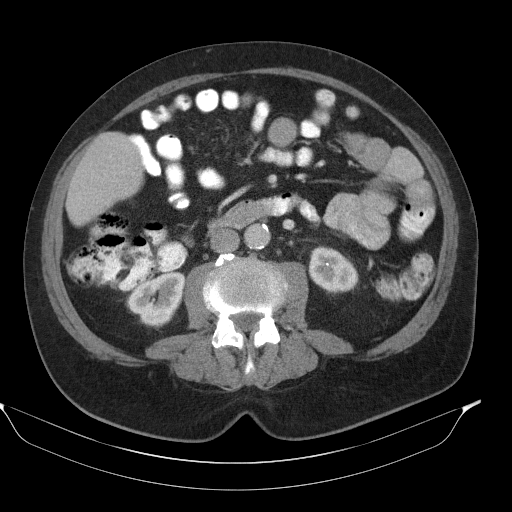
[im 63/89  soft-tissue]
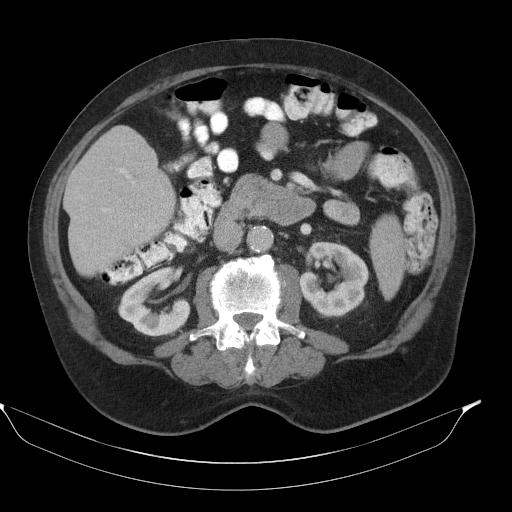
[im 63/89  bone]
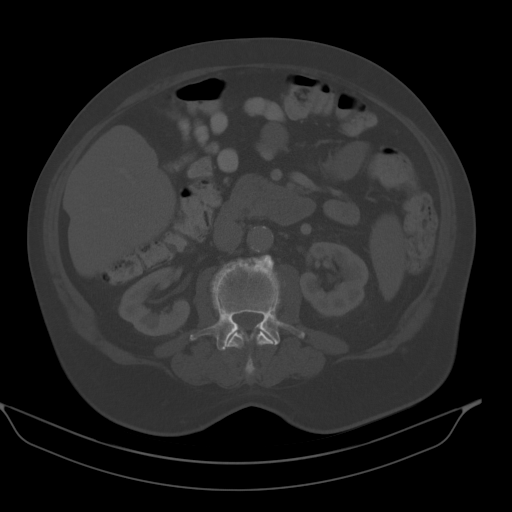
[im 68/89  soft-tissue]
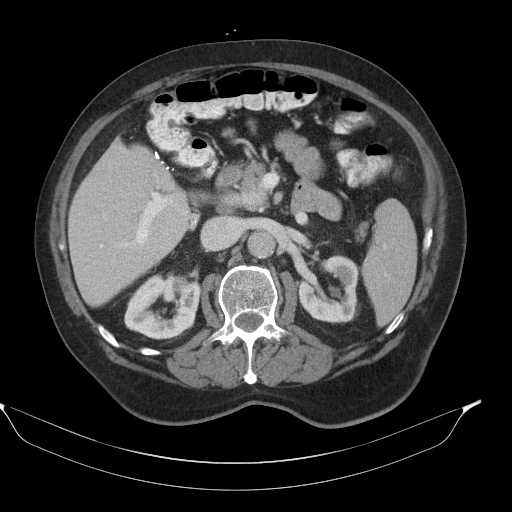
[im 68/89  lung]
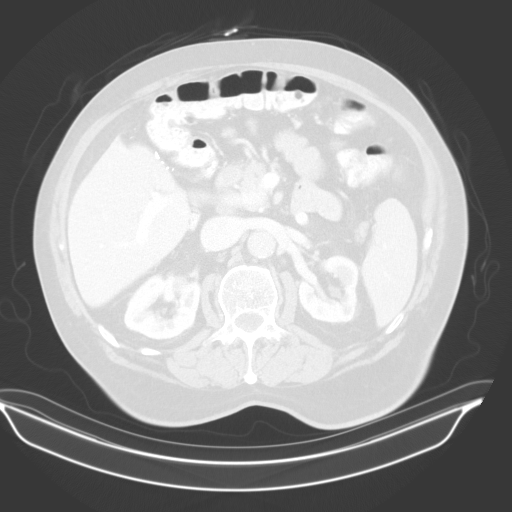
[im 73/89  lung]
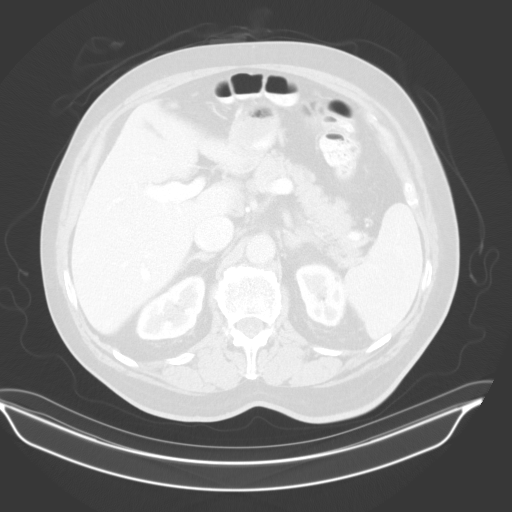
[im 78/89  soft-tissue]
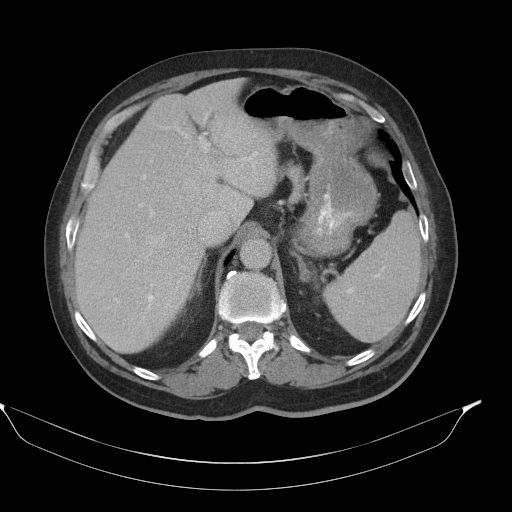
[im 78/89  lung]
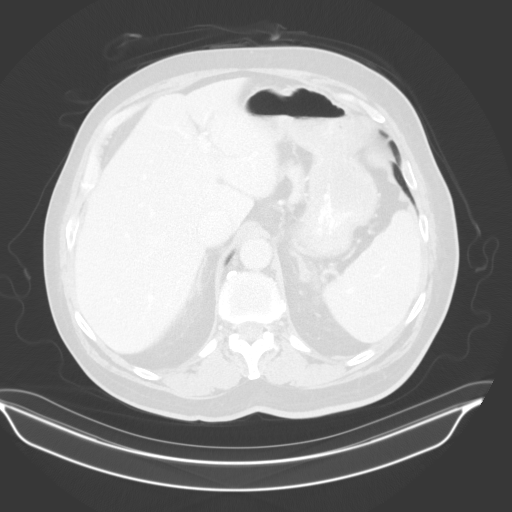
[im 83/89  soft-tissue]
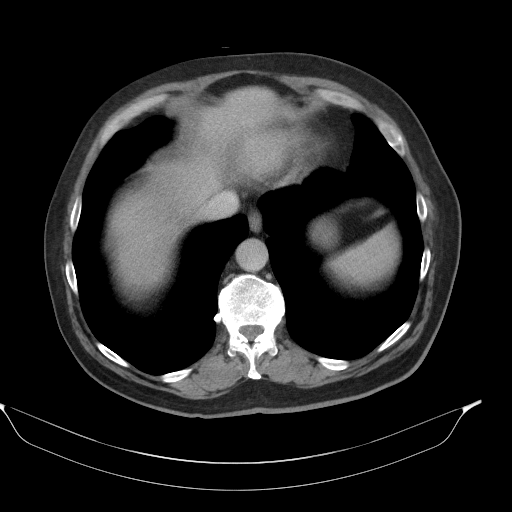
[im 83/89  lung]
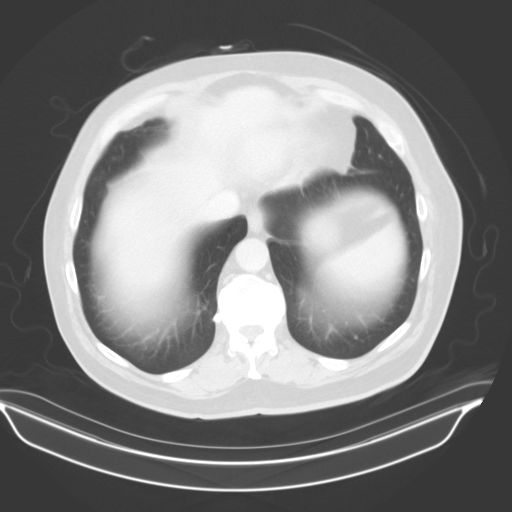

[13 of 32 positions shown; findings below may reference images not displayed]

FINDINGS: Lower chest: Unremarkable.

Hepatobiliary: No suspicious focal abnormality within the liver
parenchyma. Gallbladder is surgically absent. No intrahepatic or
extrahepatic biliary dilation.

Pancreas: 19 mm hypoattenuating lesion in the pancreas near the
junction of the body and tail ([DATE]) is stable in the interval.
cm hypoattenuating lesion in the inferior pancreatic head is
unchanged. No main duct dilatation.

Spleen: No splenomegaly. No focal mass lesion.

Adrenals/Urinary Tract: . Right thickening of the left adrenal gland
adrenal gland is stable unremarkable. 2-3 mm nonobstructing stone
noted lower pole left kidney with stable exophytic 14 mm interpolar
left renal cyst. No evidence for hydroureter. Despite the lack of
secondary changes in the left kidney and ureter, a 2 mm 1 x 1 x 3 mm
stone is identified at the left UVJ.

Stomach/Bowel: Stomach is unremarkable. No gastric wall thickening.
No evidence of outlet obstruction. Duodenum is normally positioned
as is the ligament of Treitz. No small bowel wall thickening. No
small bowel dilatation. The terminal ileum is normal. The appendix
is normal. Irregular wall thickening with pericolonic
edema/inflammation in the sigmoid colon is similar to prior with
persistence of a probable anterior intramural fistula.
Intramural/pericolonic abscess (image 62/2) is similar to prior.

Vascular/Lymphatic: There is abdominal aortic atherosclerosis
without aneurysm. There is no gastrohepatic or hepatoduodenal
ligament lymphadenopathy. No retroperitoneal or mesenteric
lymphadenopathy. No pelvic sidewall lymphadenopathy.

Reproductive: The prostate gland and seminal vesicles are
unremarkable.

Other: No intraperitoneal free fluid.

Musculoskeletal: No worrisome lytic or sclerotic osseous
abnormality. Bilateral pars interarticularis defects noted at L5.
IMPRESSION: 1. 2 mm 1 x 1 x 3 mm stone at the left UVJ without appreciable
secondary changes in the left kidney and ureter.
2. Similar appearance of irregular wall thickening with pericolonic
edema/inflammation in the sigmoid colon. Imaging features suggest
diverticulitis with persistence of a probable anterior intramural
fistula. Apparent intramural/pericolonic abscess is similar to
prior.
3. Stable appearance of hypoattenuating lesions in the pancreas.
These are less definitely cystic on today's study. MRI abdomen with
and without contrast recommended to further evaluate.
4. Aortic Atherosclerosis ([K5]-[K5]).

## 2021-01-10 MED ORDER — IOPAMIDOL (ISOVUE-300) INJECTION 61%
100.0000 mL | Freq: Once | INTRAVENOUS | Status: AC | PRN
  Administered 2021-01-10: 100 mL via INTRAVENOUS

## 2021-01-17 ENCOUNTER — Other Ambulatory Visit: Payer: Self-pay | Admitting: Obstetrics and Gynecology

## 2021-01-17 ENCOUNTER — Ambulatory Visit
Admission: RE | Admit: 2021-01-17 | Discharge: 2021-01-17 | Disposition: A | Discharge status: Home / Self Care | Payer: BC Managed Care – PPO | Source: Ambulatory Visit | Attending: Obstetrics and Gynecology | Admitting: Obstetrics and Gynecology

## 2021-01-17 ENCOUNTER — Other Ambulatory Visit: Payer: Self-pay

## 2021-01-17 DIAGNOSIS — J441 Chronic obstructive pulmonary disease with (acute) exacerbation: Secondary | ICD-10-CM

## 2021-01-17 IMAGING — CR DG CHEST W/ LORDOTIC
3 series · 3 of 3 positions shown · non-contrast
Comparison: [DATE]
COMPARISON: [DATE].
COMPARISON: [DATE]

Addendum:
CLINICAL DATA: COPD exacerbation.

EXAM:
CHEST - 1 VIEW WITH LORDOTIC VIEW(S)

[w chest pa]
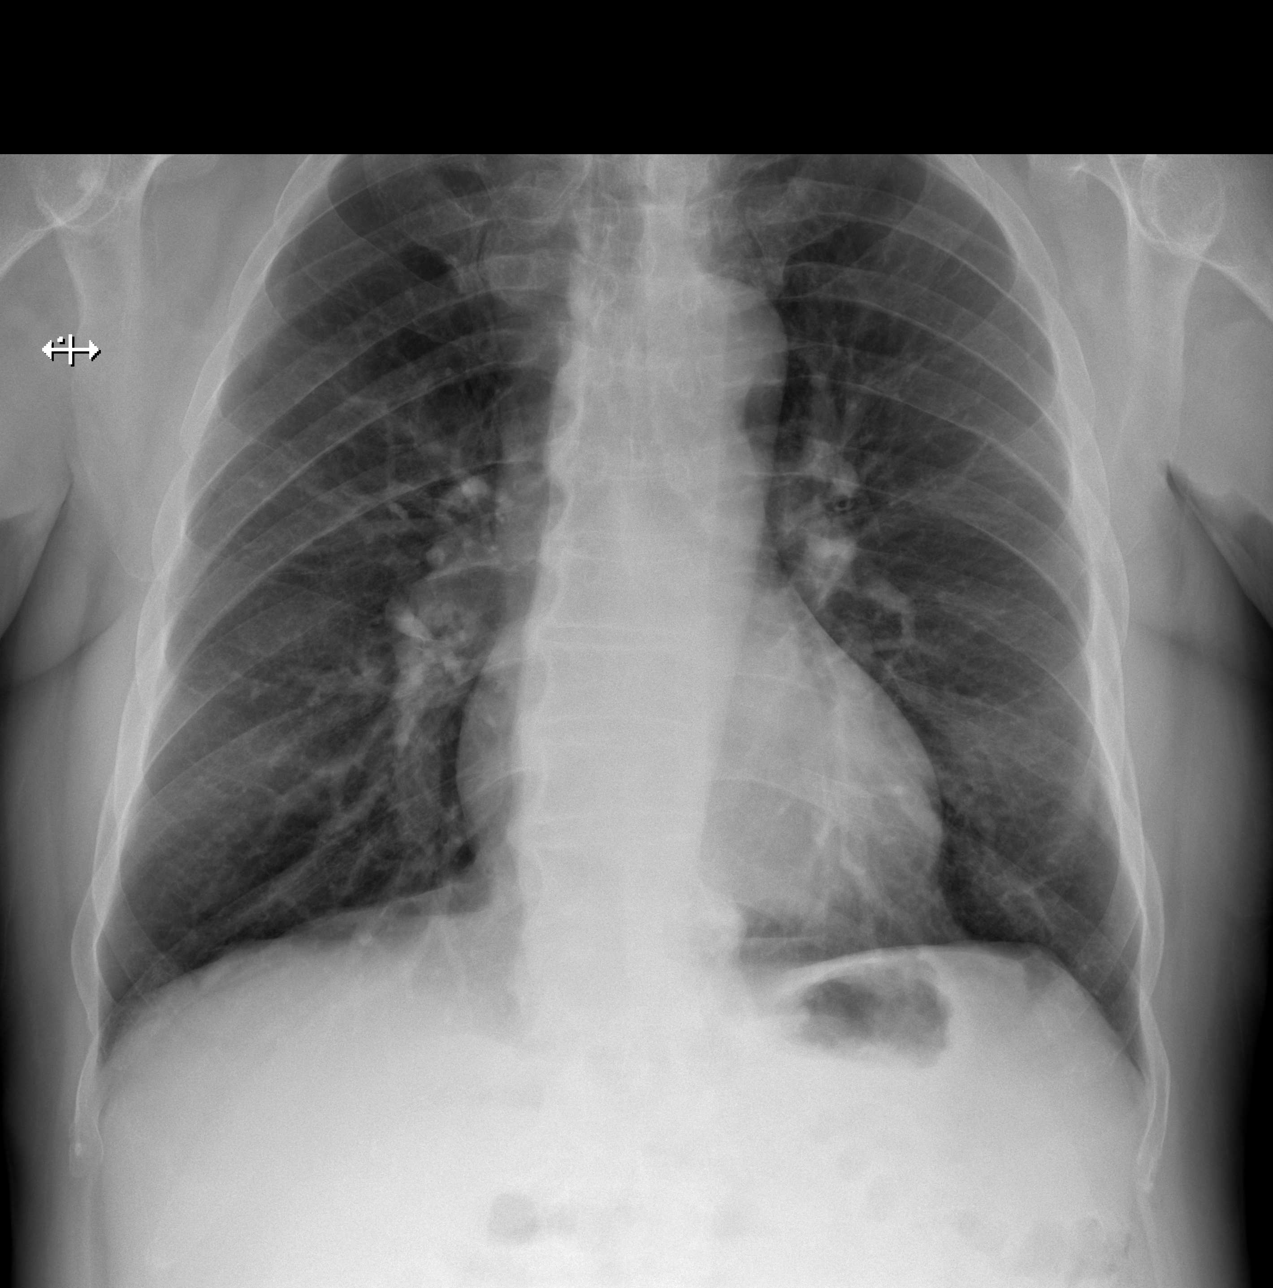

[w chest lat]
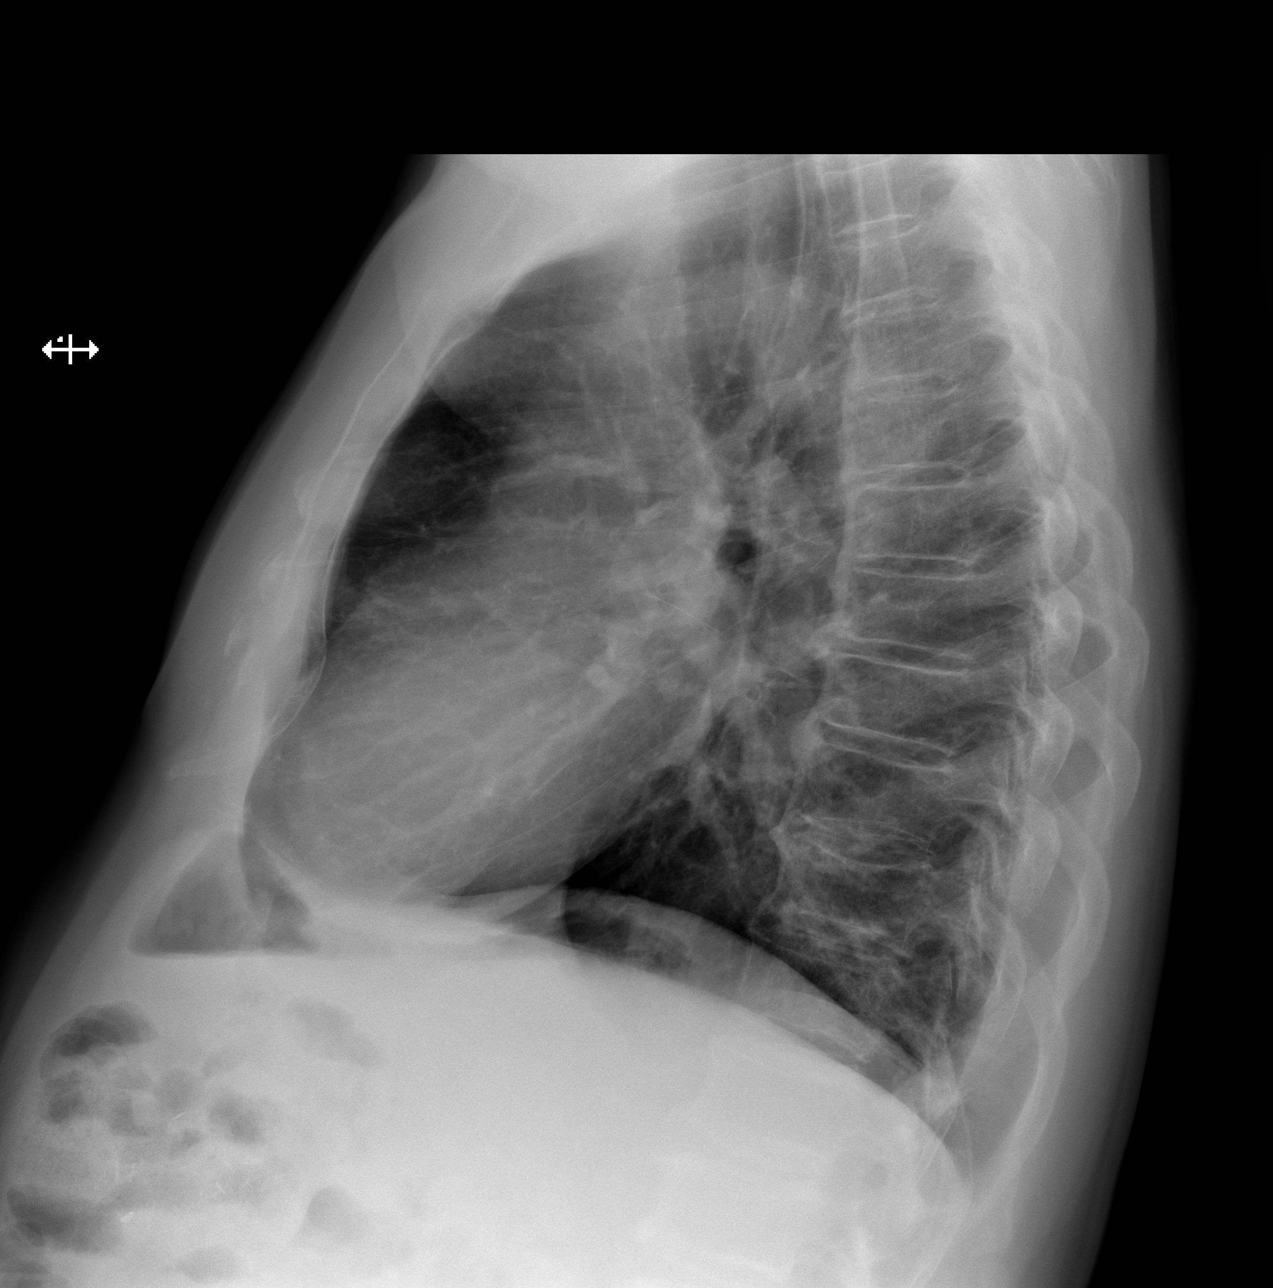

[w chest ap]
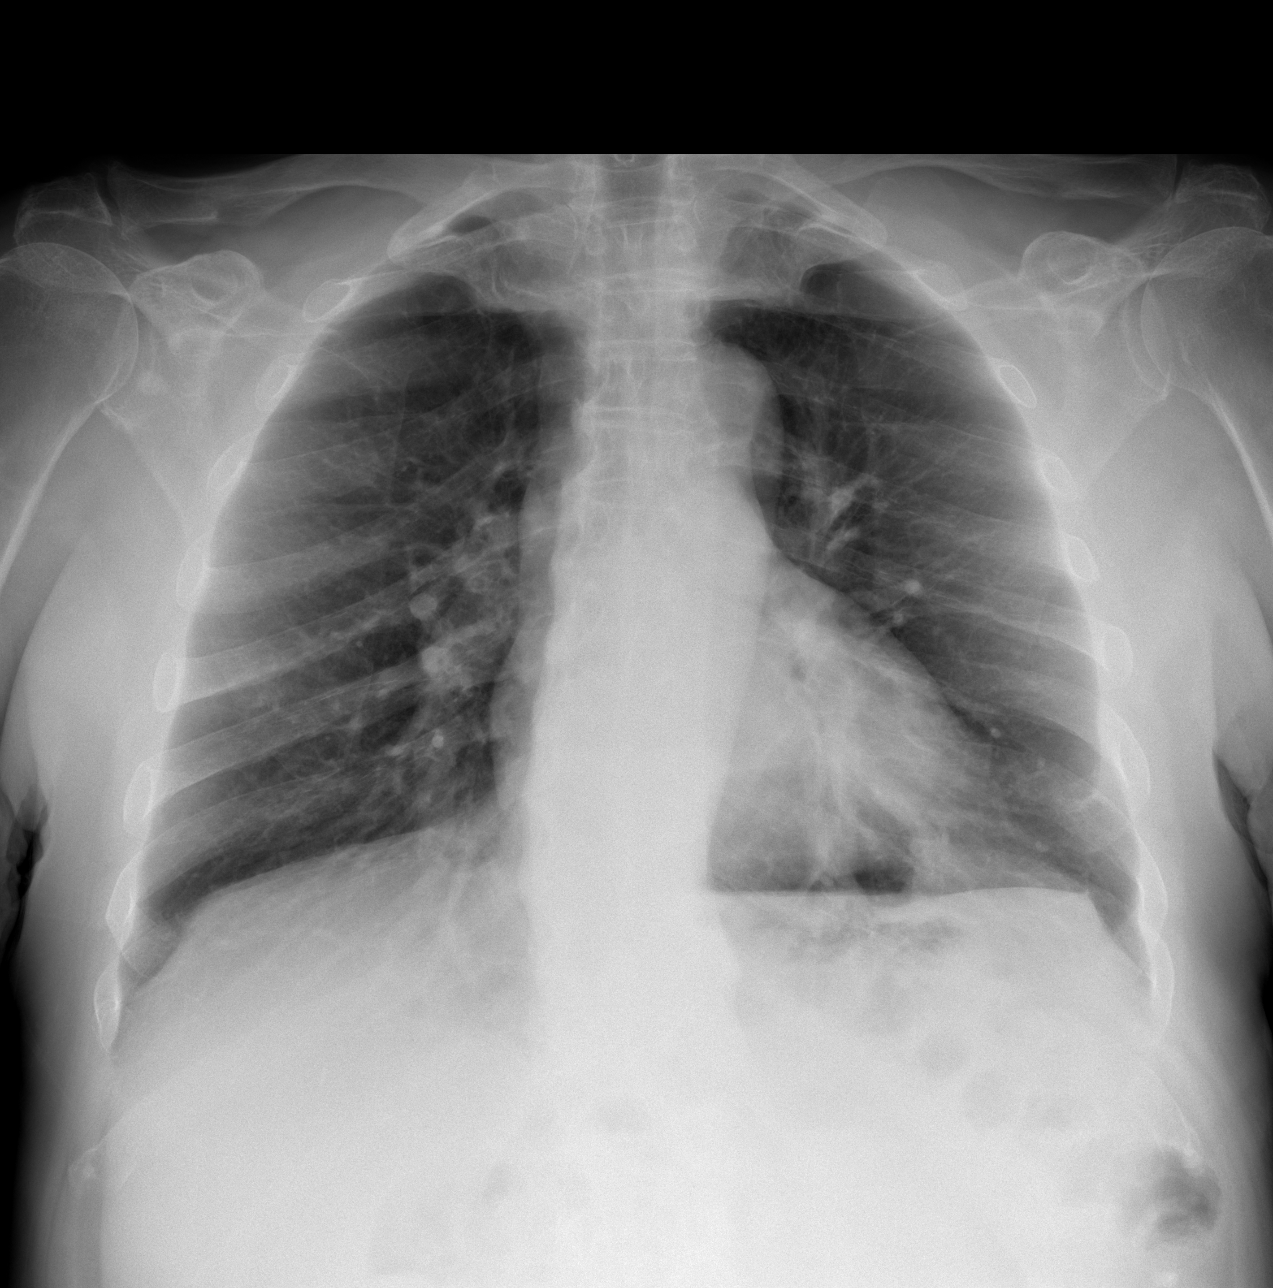

[3 of 3 positions shown; findings below may reference images not displayed]

FINDINGS: The heart, hila, and mediastinum are normal. No pneumothorax. No
nodules or masses. No focal infiltrates. Mild degenerative changes
in the thoracic spine. No other acute abnormalities are identified.
IMPRESSION: No evidence of acute cardiopulmonary disease.

*** End of Addendum ***

## 2021-03-05 ENCOUNTER — Encounter: Payer: Self-pay | Admitting: Gastroenterology

## 2021-03-05 DIAGNOSIS — D490 Neoplasm of unspecified behavior of digestive system: Secondary | ICD-10-CM | POA: Insufficient documentation

## 2021-03-05 DIAGNOSIS — N201 Calculus of ureter: Secondary | ICD-10-CM | POA: Insufficient documentation

## 2021-03-05 DIAGNOSIS — J309 Allergic rhinitis, unspecified: Secondary | ICD-10-CM | POA: Insufficient documentation

## 2021-03-05 DIAGNOSIS — J449 Chronic obstructive pulmonary disease, unspecified: Secondary | ICD-10-CM | POA: Insufficient documentation

## 2021-03-05 DIAGNOSIS — E7800 Pure hypercholesterolemia, unspecified: Secondary | ICD-10-CM | POA: Insufficient documentation

## 2021-03-05 DIAGNOSIS — R7303 Prediabetes: Secondary | ICD-10-CM | POA: Insufficient documentation

## 2021-03-05 DIAGNOSIS — K5732 Diverticulitis of large intestine without perforation or abscess without bleeding: Secondary | ICD-10-CM | POA: Insufficient documentation

## 2021-03-07 ENCOUNTER — Ambulatory Visit: Payer: BC Managed Care – PPO | Admitting: Gastroenterology

## 2021-03-07 ENCOUNTER — Encounter: Payer: Self-pay | Admitting: Gastroenterology

## 2021-03-07 VITALS — BP 126/68 | HR 68 | Ht 69.5 in | Wt 213.4 lb

## 2021-03-07 DIAGNOSIS — R933 Abnormal findings on diagnostic imaging of other parts of digestive tract: Secondary | ICD-10-CM | POA: Diagnosis not present

## 2021-03-07 DIAGNOSIS — K5732 Diverticulitis of large intestine without perforation or abscess without bleeding: Secondary | ICD-10-CM

## 2021-03-07 DIAGNOSIS — K862 Cyst of pancreas: Secondary | ICD-10-CM | POA: Diagnosis not present

## 2021-03-07 MED ORDER — PLENVU 140 G PO SOLR: ORAL | 0 refills | Status: DC

## 2021-03-07 NOTE — Progress Notes (Signed)
HPI : Paul Ibarra is a very pleasant 75 year old male with a history of tobacco use and COPD who was referred by Dr. 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  In October 2020, the patient was having symptoms of excessive flatulence and a CT scan was ordered.  The CT scan showed 2 cystic lesions of the pancreas, 1 of which was 2.2 cm near the head/uncinate process, and the other was 1.9 cm near the junction of the body and the tail.  CT scan also showed pain of the sigmoid colon with numerous diverticula and adjacent inflammatory stranding with fluid collection tracks adjacent to the sigmoid, measuring up to 3 cm.  These findings were suggestive of complicated diverticulitis.  The patient states he was not treated with antibiotics and was not hospitalized.  No drain or surgery was performed.  His symptoms of excessive flatulence resolved without intervention.  And he did well until earlier this year.  Again, he started experiencing extreme amounts of flatulence and a change in his bowel habits.  Whereas his stools used to be formed and soft, his stools became loose or watery.  A CT scan was again performed in June 2022 which showed labral signs of the pancreatic cyst, however the radiologist commented that they appeared less cystlike with a potential solid component this time.  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  This time, he was treated with antibiotics for 10 days and experienced significant improvement in his gas.  The patient denies any problems with abdominal pain, either at the time of his CT or preceding the CT.  He denies symptoms of nausea, vomiting or fevers. He has lost 60 pounds over the past 2 years, which he attributes to conscious changes in his diet.  This was spurred by his diagnosis of prediabetes,  and he was briefly put on metformin before making these dietary changes and improving his A1c through diet. The patient has never had a colonoscopy, but has had regular fecal occult blood tests which have always been negative.  He thinks the last one was about a year ago. The patient has a heavy smoking history, starting age 23, he switched to [place] in 2015. There is no family history of malignancy of the GI tract.   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    Pre-diabetes    Pure hypercholesterolemia      Past Surgical History:  Procedure Laterality Date   CATARACT EXTRACTION Bilateral    CHOLECYSTECTOMY     TONSILLECTOMY     Family History  Problem Relation Age of Onset   Diabetes Mother    Lung cancer Mother    Heart disease Father    Stroke Brother    Diabetes Brother    Asthma Daughter    Diabetes Daughter    Social History   Tobacco Use   Smoking status: Former    Packs/day: 1.00    Years: 50.00    Pack years: 50.00    Types: Cigarettes    Quit date: 03/10/2014    Years since quitting: 6.9  Smokeless tobacco: Never  Vaping Use   Vaping Use: Every day  Substance Use Topics   Alcohol use: Yes    Comment: 4-6 beers a week   Drug use: No   Current Outpatient Medications  Medication Sig Dispense Refill   albuterol (PROVENTIL HFA;VENTOLIN HFA) 108 (90 Base) MCG/ACT inhaler Inhale 2 puffs into the lungs every 6 (six) hours as needed for wheezing or shortness of breath. 1 Inhaler 0   Cholecalciferol (VITAMIN D) 50 MCG (2000 UT) CAPS Take 1 capsule by mouth daily.     fluticasone (FLONASE) 50 MCG/ACT nasal spray Place 2 sprays into both nostrils daily.     lovastatin (MEVACOR) 10 MG tablet Take 10 mg by mouth at bedtime.     LUTEIN PO  Take 1 tablet by mouth daily.     Multiple Vitamin (MULTIVITAMIN ADULT) TABS Take by mouth.     vitamin C (ASCORBIC ACID) 500 MG tablet Take 500 mg by mouth daily.     No current facility-administered medications for this visit.   Allergies  Allergen Reactions   Sulfa Antibiotics      Review of Systems: All systems reviewed and negative except where noted in HPI.    No results found.  Physical Exam: BP 126/68 (BP Location: Left Arm, Patient Position: Sitting, Cuff Size: Normal)   Pulse 68 Comment: irreguler  Ht 5' 9.5" (1.765 m) Comment: height measured without shoes  Wt 213 lb 6 oz (96.8 kg)   BMI 31.06 kg/m  Constitutional: Pleasant,well-developed, Caucasian male in no acute distress. HEENT: Normocephalic and atraumatic. Conjunctivae are normal. No scleral icterus.  Mallampati 2 Cardiovascular: Normal rate, regular rhythm.  Pulmonary/chest: Effort normal and breath sounds normal. No wheezing, rales or rhonchi. Abdominal: Soft, nondistended, nontender. Bowel sounds active throughout. There are no masses palpable. No hepatomegaly. Extremities: no edema Neurological: Alert and oriented to person place and time. Skin: Skin is warm and dry. No rashes noted. Psychiatric: Normal mood and affect. Behavior is normal.  CBC    Component Value Date/Time   WBC 12.1 (A) 06/14/2012 1841   RBC 5.41 06/14/2012 1841   HGB 15.9 06/14/2012 1841   HCT 51.9 06/14/2012 1841   MCV 95.9 06/14/2012 1841   MCH 29.4 06/14/2012 1841   MCHC 30.6 (A) 06/14/2012 1841    CMP  No results found for: NA, K, CL, CO2, GLUCOSE, BUN, CREATININE, CALCIUM, PROT, ALBUMIN, AST, ALT, ALKPHOS, BILITOT, GFRNONAA, GFRAA  CLINICAL DATA:  Left lower quadrant abdominal pain.   EXAM: CT ABDOMEN AND PELVIS WITH CONTRAST   TECHNIQUE: Multidetector CT imaging of the abdomen and pelvis was performed using the standard protocol following bolus administration of intravenous contrast.   CONTRAST:  173m  ISOVUE-300 IOPAMIDOL (ISOVUE-300) INJECTION 61%   COMPARISON:  None.   FINDINGS: Lower chest: Clear lung bases.   Hepatobiliary: No focal liver abnormality is seen. Status post cholecystectomy. No biliary dilatation.   Pancreas: Cystic pancreatic masses. 1 projects from the inferior head/uncinate process, measuring 2.2 x 1.3 x 1.2 cm in size. The other arises at the junction of the body and tail, measuring 1.9 x 1.1 x 1.2 cm. This appears to connect to the main pancreatic duct. No other pancreatic masses or lesions. No inflammation.   Spleen: Normal in size without focal abnormality.   Adrenals/Urinary Tract: No adrenal masses. Bilateral renal cortical thinning. Cortical exophytic low-density mass, mid to lower pole, left kidney, measuring 9 mm consistent with a cyst. Are parenchymal calcifications in the mid and lower  pole of the left kidney. No other masses. No hydronephrosis. Normal ureters. Bladder is unremarkable.   Stomach/Bowel: There is wall thickening of the mid sigmoid colon where there are multiple diverticula mild adjacent inflammatory stranding. A fluid collection tracks along the anterior wall of this portion of the sigmoid colon. The fluid containing component which has nondependent air, measures 3.2 x 1.9 x 2.9 cm. This connects to a thinner tract of air that extends along the anterior wall the sigmoid colon for approximately 10 cm in length.   There are additional colonic diverticula with no other areas of inflammation colon is normal in caliber. Normal stomach and small bowel. Normal appendix visualized.   Vascular/Lymphatic: Aortic atherosclerosis. No aneurysm. No enlarged lymph nodes.   Reproductive: Unremarkable.   Other: No ascites no hernia.   Musculoskeletal: Chronic bilateral pars defects at L5-S1 with a grade 1 anterolisthesis. No acute fractures. No osteoblastic or osteolytic lesions.   IMPRESSION: 1. Sigmoid colon diverticulitis, with only  mild inflammation, but with an intramural abscess along the anterior wall of the involved segment of sigmoid colon. 2. No other acute abnormalities. 3. Two pancreatic cystic masses. One at the junction of the body and tail appear to communicate with the main pancreatic duct. It measures 1.9 cm in greatest dimension. The other from the inferior head/uncinate process measures 2.2 cm and is not really communicate with the main pancreatic duct. Recommend follow-up CT scan yearly for 5 years and every 2 years for 4 more years. This is based on consensus guidelines for 0.5-1.9 cm cyst communicating with the main pancreatic duct. 4. Aortic atherosclerosis.     Electronically Signed   By: Lajean Manes M.D.   On: 06/09/2019 15:13   ------------------------------------------------------------------------------------------   CLINICAL DATA:  Abdominal pain.  History of diverticulitis.   EXAM: CT ABDOMEN AND PELVIS WITH CONTRAST   TECHNIQUE: Multidetector CT imaging of the abdomen and pelvis was performed using the standard protocol following bolus administration of intravenous contrast.   CONTRAST:  170m ISOVUE-300 IOPAMIDOL (ISOVUE-300) INJECTION 61%   COMPARISON:  06/09/2019   FINDINGS: Lower chest: Unremarkable.   Hepatobiliary: No suspicious focal abnormality within the liver parenchyma. Gallbladder is surgically absent. No intrahepatic or extrahepatic biliary dilation.   Pancreas: 19 mm hypoattenuating lesion in the pancreas near the junction of the body and tail (15/2) is stable in the interval. 2.2 cm hypoattenuating lesion in the inferior pancreatic head is unchanged. No main duct dilatation.   Spleen: No splenomegaly. No focal mass lesion.   Adrenals/Urinary Tract: . Right thickening of the left adrenal gland adrenal gland is stable unremarkable. 2-3 mm nonobstructing stone noted lower pole left kidney with stable exophytic 14 mm interpolar left renal cyst. No  evidence for hydroureter. Despite the lack of secondary changes in the left kidney and ureter, a 2 mm 1 x 1 x 3 mm stone is identified at the left UVJ.   Stomach/Bowel: Stomach is unremarkable. No gastric wall thickening. No evidence of outlet obstruction. Duodenum is normally positioned as is the ligament of Treitz. No small bowel wall thickening. No small bowel dilatation. The terminal ileum is normal. The appendix is normal. 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 Intramural/pericolonic abscess (image 62/2) is similar to prior.   Vascular/Lymphatic: There is abdominal aortic atherosclerosis without aneurysm. There is no gastrohepatic or hepatoduodenal ligament lymphadenopathy. No retroperitoneal or mesenteric lymphadenopathy. No pelvic sidewall lymphadenopathy.  Reproductive: The prostate gland and seminal vesicles are unremarkable.   Other: No intraperitoneal free fluid.   Musculoskeletal: No worrisome lytic or sclerotic osseous abnormality. Bilateral pars interarticularis defects noted at L5.   IMPRESSION: 1. 2 mm 1 x 1 x 3 mm stone at the left UVJ without appreciable secondary changes in the left kidney and ureter. 2. Similar appearance of irregular wall thickening with pericolonic edema/inflammation in the sigmoid colon. 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 Apparent intramural/pericolonic abscess is similar to prior. 3. Stable appearance of hypoattenuating lesions in the pancreas. These are less definitely cystic on today's study. MRI abdomen with and without contrast recommended to further evaluate. 4. Aortic Atherosclerosis (ICD10-I70.0).     Electronically Signed   By: Misty Stanley M.D.   On: 01/10/2021 14:46      ASSESSMENT AND PLAN: 75 year old male with CT findings in  2020 and 2022 most consistent with complicated diverticulitis with abscess and fistula formation, however with no clinical history that would support this diagnosis.  His only symptoms during both these occasions were increase in flatulence, and in 2020 to a change in his bowel habits manifested by decreased stool consistency.  He denies any history of significant abdominal pain, fever/chills, nausea or vomiting that would correlate with a history of diverticulitis.  The discrepancy between his lack of symptoms and his CT abnormalities is striking.  I recommended a colonoscopy to further evaluate this abnormal area in the sigmoid and confirmed that this is indeed diverticulitis and not another process such as malignancy or inflammatory bowel disease. With regards to his pancreatic cysts, an MRI is necessary to further characterize these cysts.  If any concerning features are identified on MRI or if there is suspicion for solid lesions, an EUS will be performed.  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   I spent a total of 45 minutes reviewing the patient's medical record, interviewing and examining the patient, reviewing his CT images and discussing his diagnosis and management of his condition going forward, and documenting in the medical record   Traye Bates E. Candis Schatz, [facility]     Geannie Risen, MD

## 2021-03-07 NOTE — Patient Instructions (Signed)
If you are age 74 or older, your body mass index should be between 23-30. Your Body mass index is 31.06 kg/m. If this is out of the aforementioned range listed, please consider follow up with your Primary Care Provider.  If you are age 18 or younger, your body mass index should be between 19-25. Your Body mass index is 31.06 kg/m. If this is out of the aformentioned range listed, please consider follow up with your Primary Care Provider.   You have been scheduled for an MRI at [facility] on 03/12/21. Your appointment time is 7:30am. Please arrive to admitting (at main entrance of the hospital) 15 minutes prior to your appointment time for registration purposes. Please make certain not to have anything to eat or drink 6 hours prior to your test. In addition, if you have any metal in your body, have a pacemaker or defibrillator, please be sure to let your ordering physician know. This test typically takes 45 minutes to 1 hour to complete. Should you need to reschedule, please call [phone] to do so.   You have been scheduled for a colonoscopy. Please follow written instructions given to you at your visit today.  Please pick up your prep supplies at the pharmacy within the next 1-3 days. If you use inhalers (even only as needed), please bring them with you on the day of your procedure.  Due to recent changes in healthcare laws, you may see the results of your imaging and laboratory studies on MyChart before your provider has had a chance to review them.  We understand that in some cases there may be results that are confusing or concerning to you. Not all laboratory results come back in the same time frame and the provider may be waiting for multiple results in order to interpret others.  Please give [place] 48 hours in order for your provider to thoroughly review all the results before contacting the office for clarification of your results.    The [facility] providers would like to encourage you to use  [facility] to communicate with providers for non-urgent requests or questions.  Due to long hold times on the telephone, sending your provider a message by [facility] may be a faster and more efficient way to get a response.  Please allow 48 business hours for a response.  Please remember that this is for non-urgent requests.   It was a pleasure to see you today!  Thank you for trusting me with your gastrointestinal care!    Scott E. Candis Schatz, MD

## 2021-03-10 ENCOUNTER — Telehealth: Payer: Self-pay | Admitting: Gastroenterology

## 2021-03-10 ENCOUNTER — Other Ambulatory Visit: Payer: Self-pay

## 2021-03-10 MED ORDER — PLENVU 140 G PO SOLR
ORAL | 0 refills | Status: DC
Start: 2021-03-10 — End: 2021-03-17

## 2021-03-10 NOTE — Telephone Encounter (Signed)
Plenvu was sent to [facility].

## 2021-03-10 NOTE — Telephone Encounter (Signed)
Inbound call from [place] at [facility]. Patient prescription is to be sent to [facility]. [phone]

## 2021-03-12 ENCOUNTER — Ambulatory Visit (HOSPITAL_COMMUNITY)
Admission: RE | Admit: 2021-03-12 | Discharge: 2021-03-12 | Disposition: A | Discharge status: Home / Self Care | Payer: BC Managed Care – PPO | Source: Ambulatory Visit | Attending: Gastroenterology | Admitting: Gastroenterology

## 2021-03-12 ENCOUNTER — Other Ambulatory Visit: Payer: Self-pay | Admitting: Gastroenterology

## 2021-03-12 ENCOUNTER — Other Ambulatory Visit: Payer: Self-pay

## 2021-03-12 DIAGNOSIS — K862 Cyst of pancreas: Secondary | ICD-10-CM

## 2021-03-12 DIAGNOSIS — K5732 Diverticulitis of large intestine without perforation or abscess without bleeding: Secondary | ICD-10-CM

## 2021-03-12 IMAGING — MR MR ABDOMEN WO/W CM MRCP
15 of 20 series · 33 of 48 positions shown · IV contrast (gadavist)
Comparison: CT abdomen pelvis, [DATE], [DATE]

CLINICAL DATA: Suspected pancreatic cystic lesions incidentally
identified by prior CT

EXAM:
MRI ABDOMEN WITHOUT AND WITH CONTRAST (INCLUDING MRCP)
TECHNIQUE: Multiplanar multisequence MR imaging of the abdomen was performed
both before and after the administration of intravenous contrast.
Heavily T2-weighted images of the biliary and pancreatic ducts were
obtained, and three-dimensional MRCP images were rendered by post
processing.
CONTRAST:  10mL GADAVIST GADOBUTROL 1 MMOL/ML IV SOLN

[Series 3: T2 fat-sat · axial · 6.0mm · 1.25mm/px · 1 of 34 slices shown]
[im 1/34]
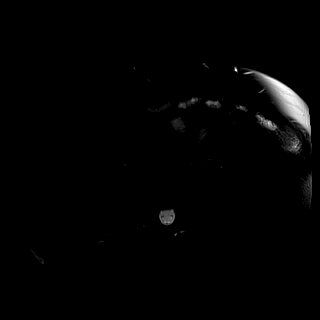

[Series 5: DWI · axial · 6.0mm · 1.49mm/px · z∈[-226,+25]mm · 2 of 68 slices shown (1 of 2)]
[im 1/68]
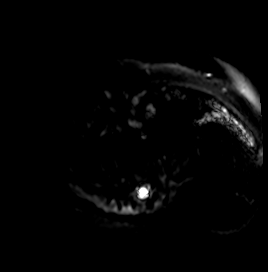
[im 68/68]
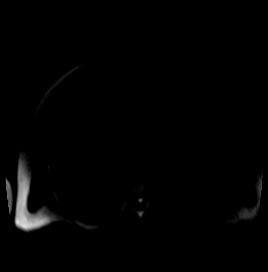

[Series 6: DWI · axial · 6.0mm · 1.49mm/px · 1 of 36 slices shown (2 of 2)]
[im 1/36]
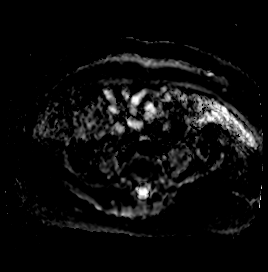

[Series 8: cor_3d_spc_trig · coronal · 1.0mm · 0.49mm/px · 4 of 96 slices shown]
[im 1/96]
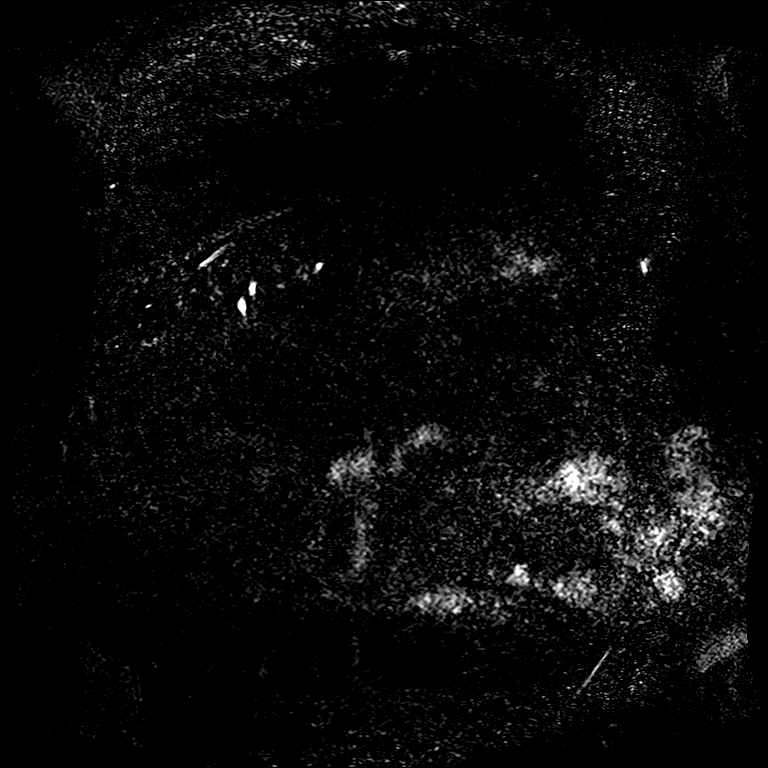
[im 32/96]
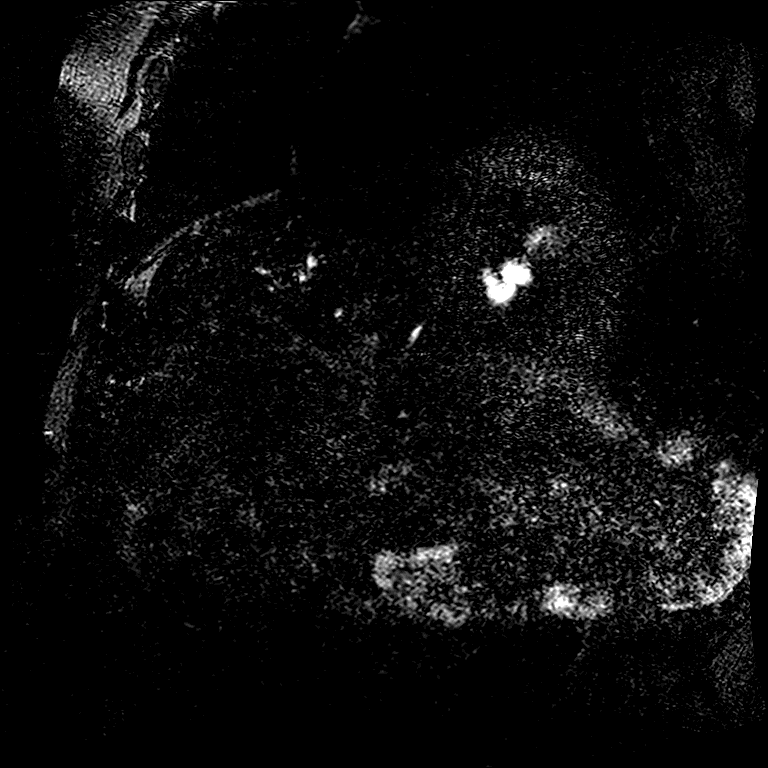
[im 64/96]
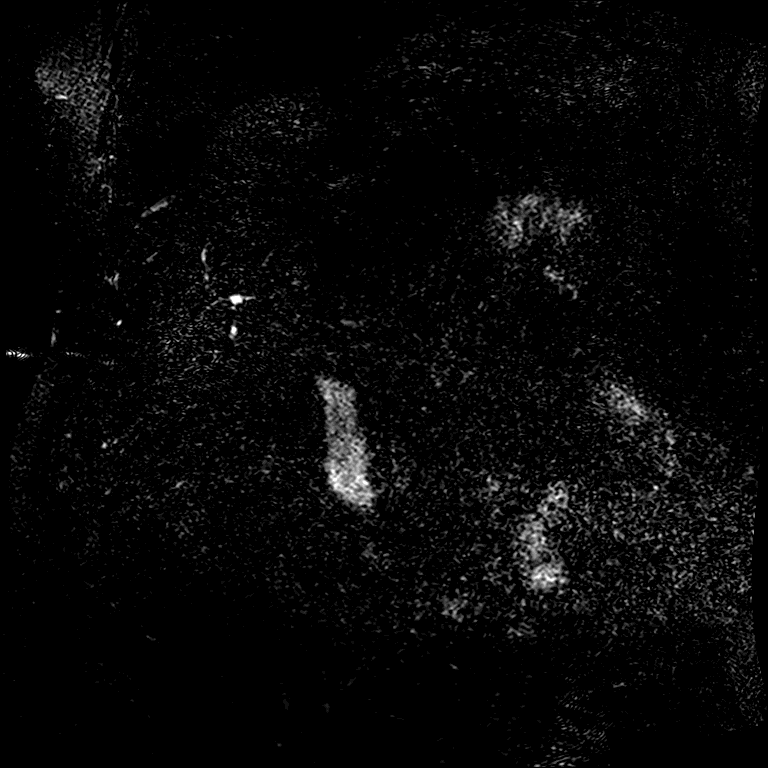
[im 96/96]
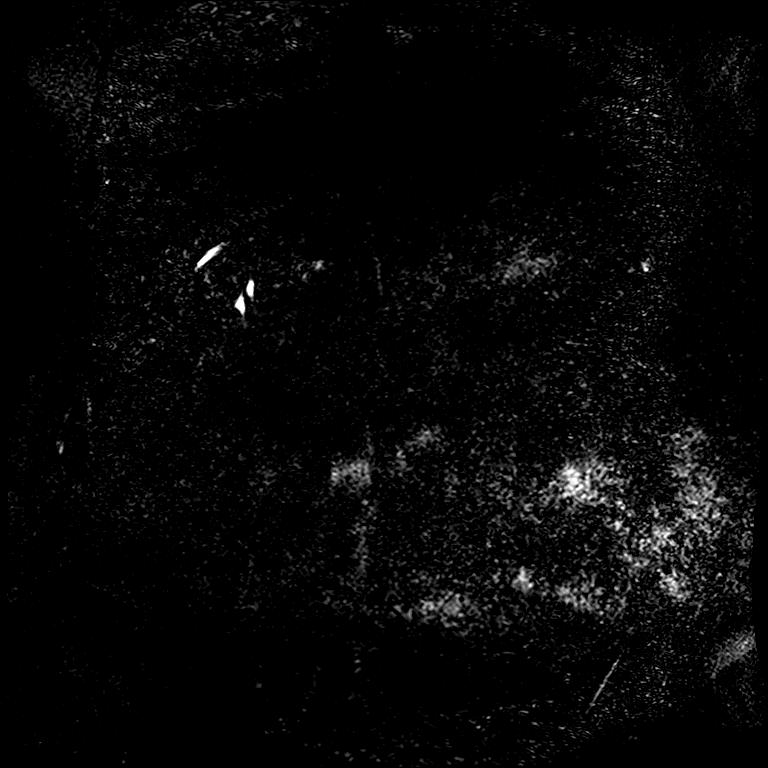

[Series 11: T2 · coronal · 6.0mm · 1.48mm/px · 1 of 39 slices shown (1 of 2)]
[im 1/39]
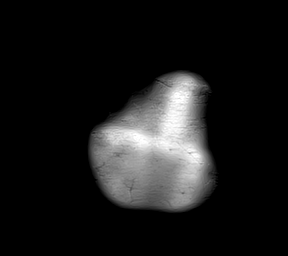

[Series 12: T1 · axial · 3.0mm · 1.25mm/px · z∈[-221,+16]mm · 3 of 80 slices shown (1 of 2)]
[im 1/80]
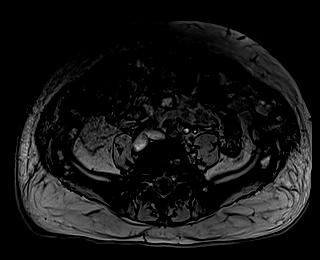
[im 40/80]
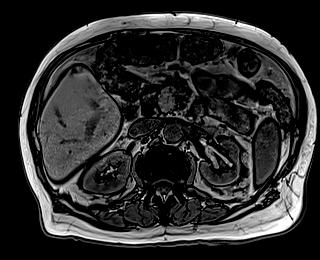
[im 80/80]
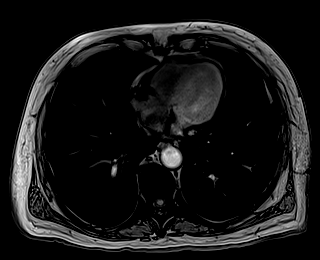

[Series 13: T1 · axial · 3.0mm · 1.25mm/px · z∈[-221,+16]mm · 3 of 77 slices shown (2 of 2)]
[im 1/77]
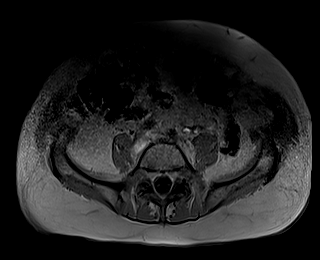
[im 39/77]
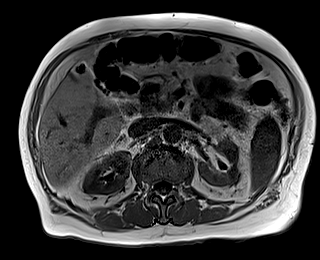
[im 77/77]
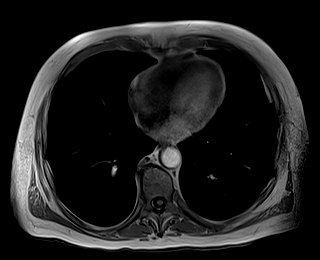

[Series 14: cor obl thk · sagittal · 50.0mm · 0.78mm/px · 1 of 9 slices shown]
[im 1/9]
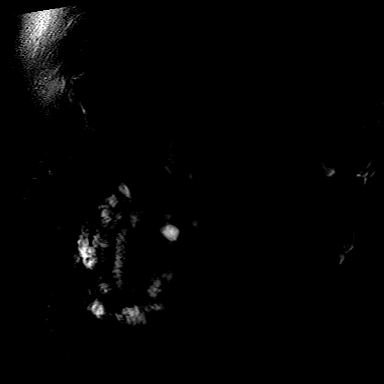

[Series 15: T2 · axial · 6.0mm · 1.56mm/px · 1 of 39 slices shown (2 of 2)]
[im 1/39]
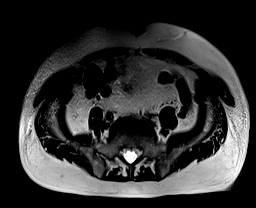

[Series 17: T1 dynamic · axial · 3.0mm · 1.25mm/px · z∈[-233,+4]mm · 3 of 80 slices shown (1 of 6)]
[im 1/80]
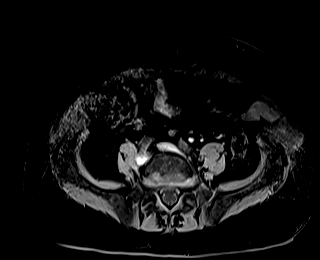
[im 40/80]
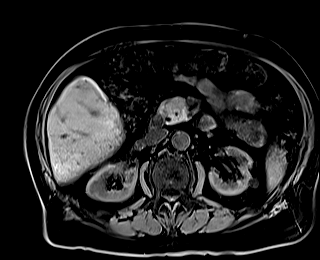
[im 80/80]
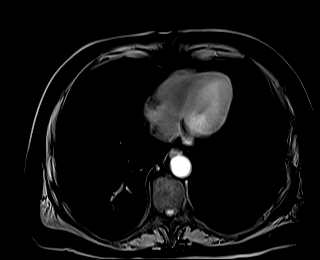

[Series 21: T1 dynamic · axial · 3.0mm · 1.25mm/px · z∈[-233,+4]mm · 3 of 80 slices shown (2 of 6)]
[im 1/80]
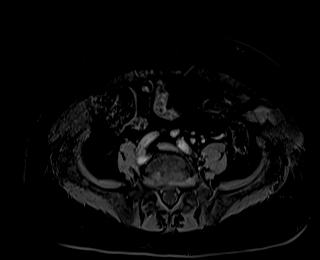
[im 40/80]
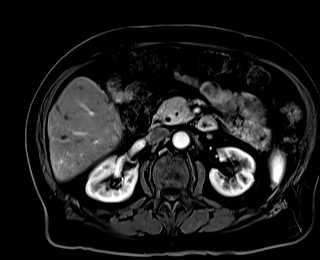
[im 80/80]
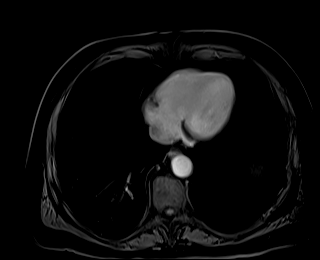

[Series 22: T1 dynamic · axial · 3.0mm · 1.25mm/px · z∈[-233,+4]mm · 3 of 80 slices shown (3 of 6)]
[im 1/80]
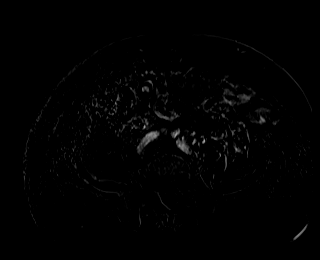
[im 40/80]
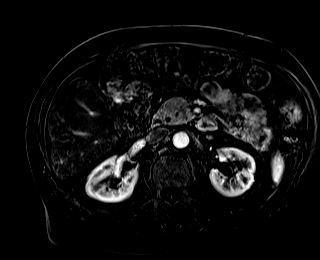
[im 80/80]
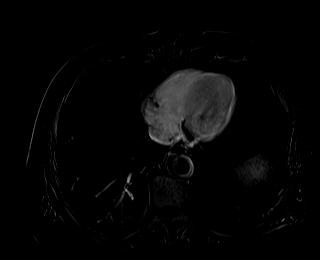

[Series 25: T1 dynamic · axial · 3.0mm · 1.25mm/px · z∈[-233,+4]mm · 3 of 80 slices shown (4 of 6)]
[im 1/80]
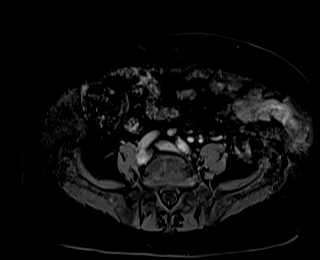
[im 40/80]
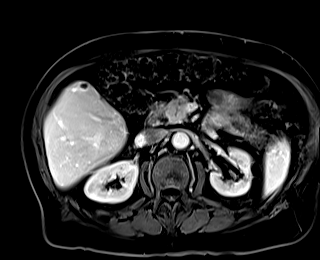
[im 80/80]
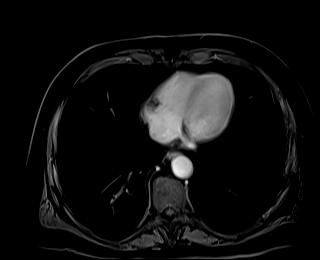

[Series 26: T1 dynamic · axial · 3.0mm · 1.25mm/px · z∈[-233,+4]mm · 3 of 80 slices shown (5 of 6)]
[im 1/80]
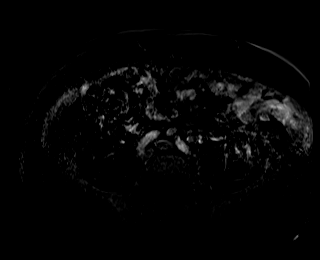
[im 40/80]
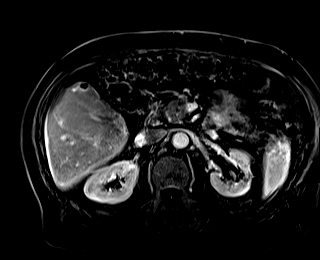
[im 80/80]
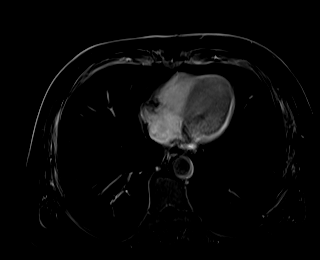

[Series 29: T1 dynamic · axial · 3.0mm · 1.25mm/px · 1 of 80 slices shown (6 of 6)]
[im 1/80]
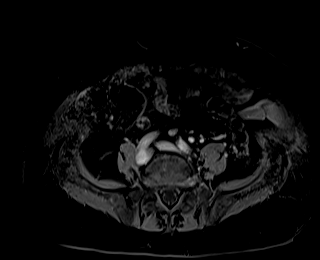

[33 of 48 positions shown; findings below may reference images not displayed]

FINDINGS: Lower chest: No acute findings.

Hepatobiliary: No mass or other parenchymal abnormality identified.
Status post cholecystectomy. No biliary ductal dilatation.

Pancreas: Unchanged fluid signal cystic lesion of the ventral
pancreatic tail measuring 1.4 cm, communicating to the main
pancreatic duct (series 3, image 12) and of the pancreatic uncinate
measuring 1.9 cm (series 15, image 26). No solid mass, inflammatory
changes, or other parenchymal abnormality identified. No pancreatic
ductal dilatation.

Spleen:  Within normal limits in size and appearance.

Adrenals/Urinary Tract: No masses identified. Fluid signal exophytic
cyst of the lateral midportion of the left kidney. No evidence of
hydronephrosis.

Stomach/Bowel: Visualized portions within the abdomen are
unremarkable.

Vascular/Lymphatic: No pathologically enlarged lymph nodes
identified. No abdominal aortic aneurysm demonstrated.

Other:  None.

Musculoskeletal: No suspicious bone lesions identified.
IMPRESSION: Unchanged fluid signal cystic lesion of the ventral pancreatic tail
measuring 1.4 cm, communicating to the main pancreatic duct and of
the pancreatic uncinate measuring 1.9 cm. These are most likely side
branch IPMNs. As significant initial stability has been established
and there is no observed increased risk of malignancy for such
lesions smaller than 2 cm, no further follow-up or characterization
is required.

## 2021-03-12 IMAGING — MR MR 3D RECON AT SCANNER
15 of 20 series · 33 of 48 positions shown · IV contrast (gadavist)
Comparison: CT abdomen pelvis, [DATE], [DATE]

CLINICAL DATA: Suspected pancreatic cystic lesions incidentally
identified by prior CT

EXAM:
MRI ABDOMEN WITHOUT AND WITH CONTRAST (INCLUDING MRCP)
TECHNIQUE: Multiplanar multisequence MR imaging of the abdomen was performed
both before and after the administration of intravenous contrast.
Heavily T2-weighted images of the biliary and pancreatic ducts were
obtained, and three-dimensional MRCP images were rendered by post
processing.
CONTRAST:  10mL GADAVIST GADOBUTROL 1 MMOL/ML IV SOLN

[Series 3: T2 fat-sat · axial · 6.0mm · 1.25mm/px · 1 of 36 slices shown]
[im 1/36]
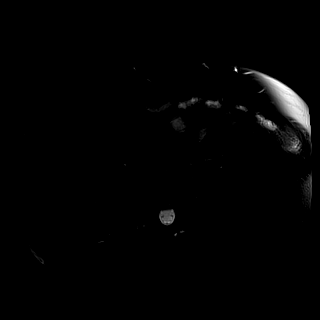

[Series 5: DWI · axial · 6.0mm · 1.49mm/px · z∈[-226,+25]mm · 2 of 72 slices shown (1 of 2)]
[im 1/72]
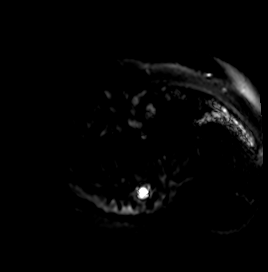
[im 72/72]
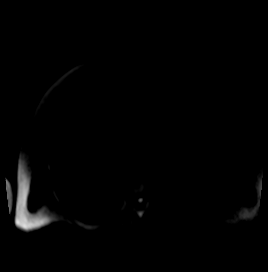

[Series 6: DWI · axial · 6.0mm · 1.49mm/px · 1 of 36 slices shown (2 of 2)]
[im 1/36]
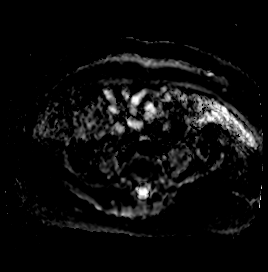

[Series 8: cor_3d_spc_trig · coronal · 1.0mm · 0.49mm/px · 4 of 96 slices shown]
[im 1/96]
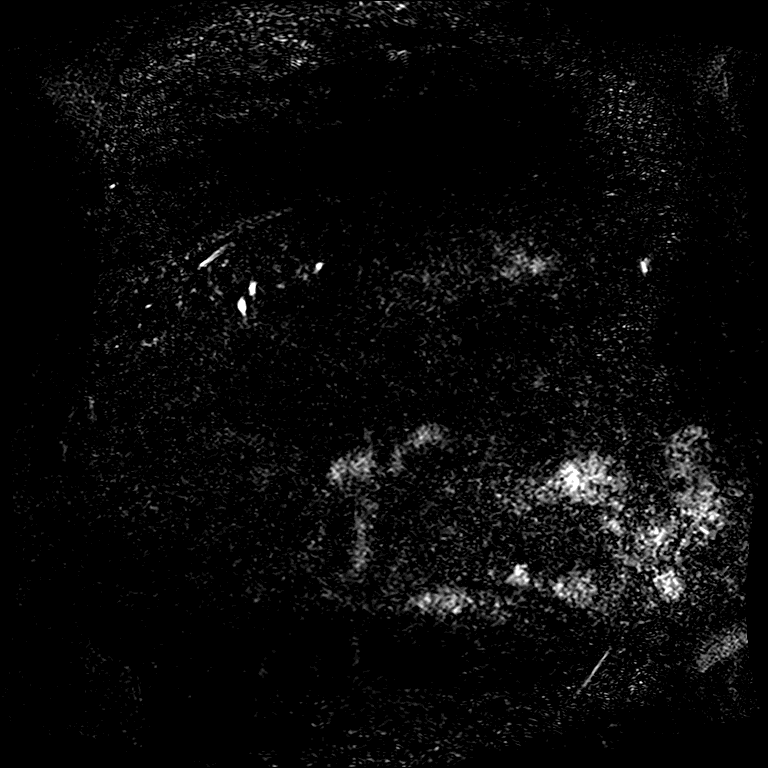
[im 32/96]
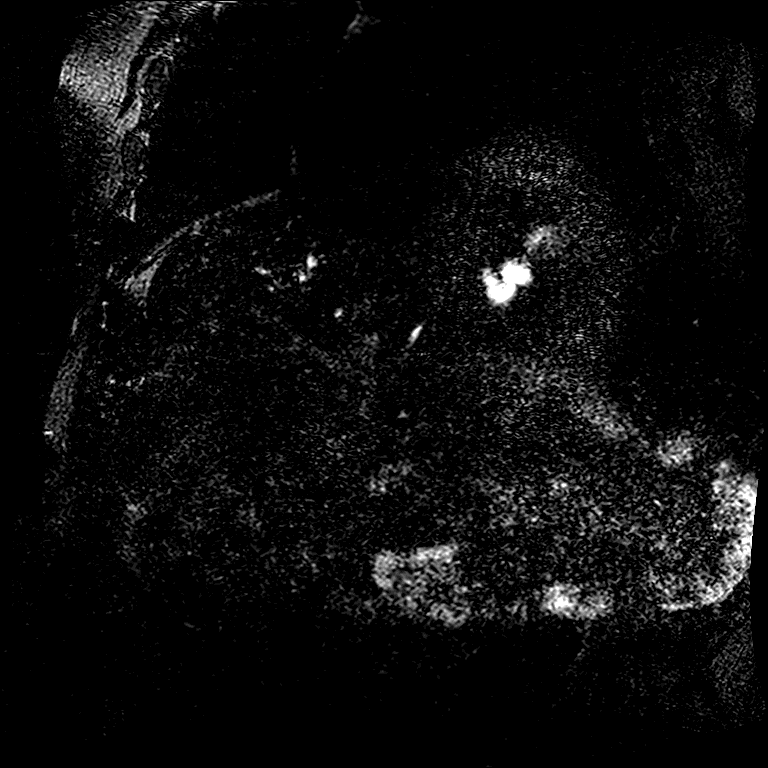
[im 64/96]
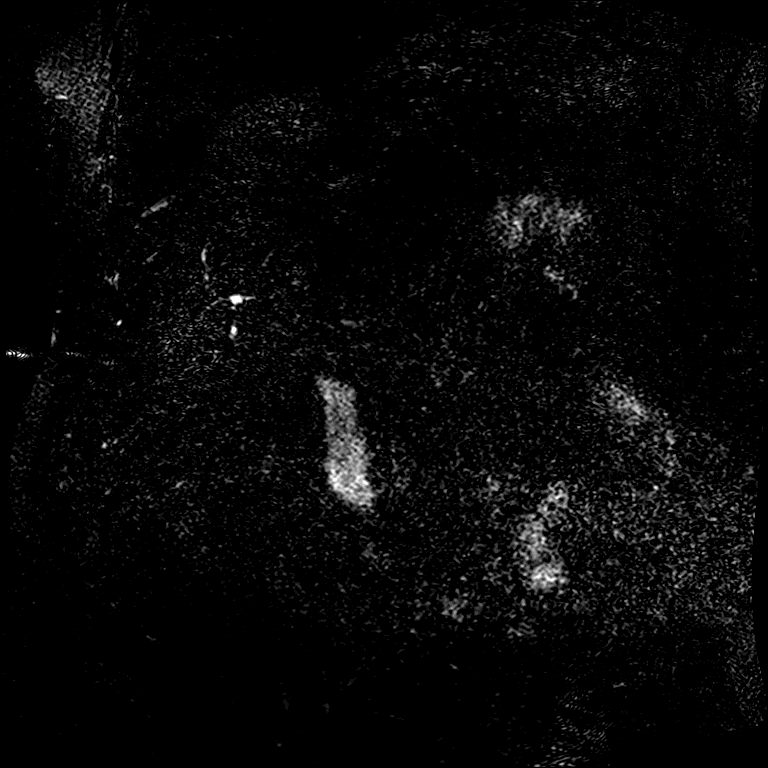
[im 96/96]
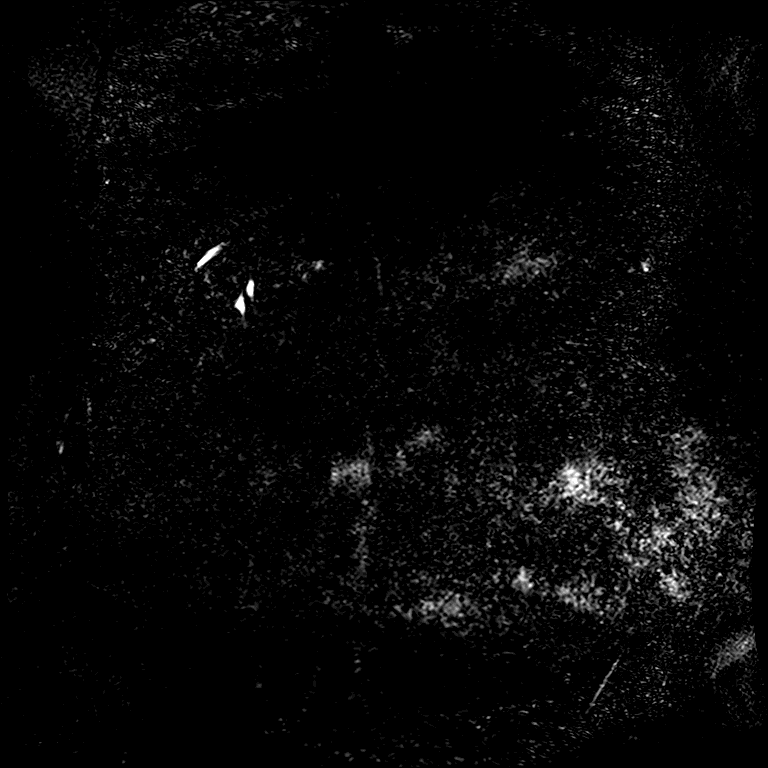

[Series 11: T2 · coronal · 6.0mm · 1.48mm/px · 1 of 39 slices shown (1 of 2)]
[im 1/39]
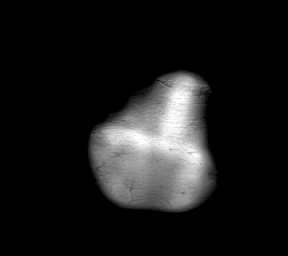

[Series 12: T1 · axial · 3.0mm · 1.25mm/px · z∈[-221,+16]mm · 3 of 80 slices shown (1 of 2)]
[im 1/80]
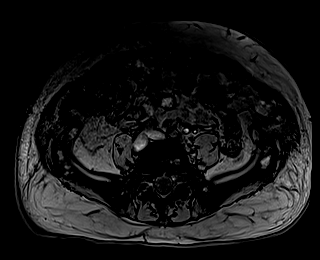
[im 40/80]
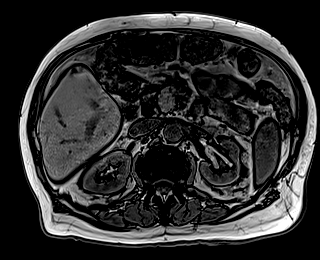
[im 80/80]
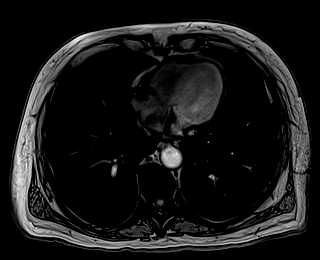

[Series 13: T1 · axial · 3.0mm · 1.25mm/px · z∈[-221,+16]mm · 3 of 80 slices shown (2 of 2)]
[im 1/80]
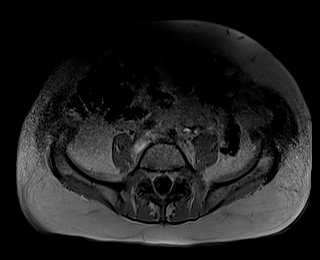
[im 40/80]
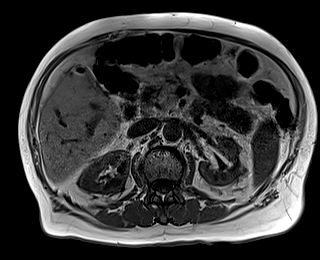
[im 80/80]
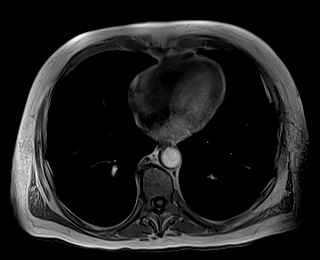

[Series 14: cor obl thk · sagittal · 50.0mm · 0.78mm/px · 1 of 9 slices shown]
[im 1/9]
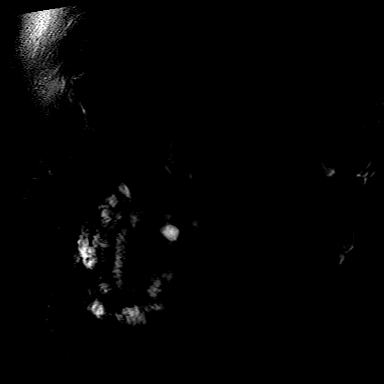

[Series 15: T2 · axial · 6.0mm · 1.56mm/px · 1 of 39 slices shown (2 of 2)]
[im 1/39]
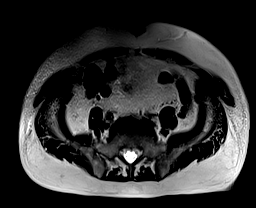

[Series 17: T1 dynamic · axial · 3.0mm · 1.25mm/px · z∈[-233,+4]mm · 3 of 80 slices shown (1 of 6)]
[im 1/80]
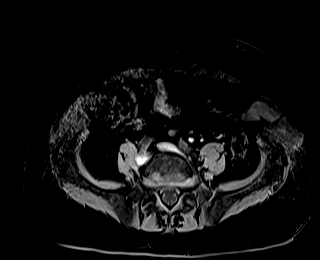
[im 40/80]
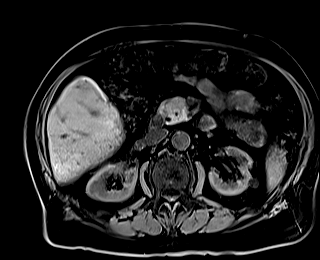
[im 80/80]
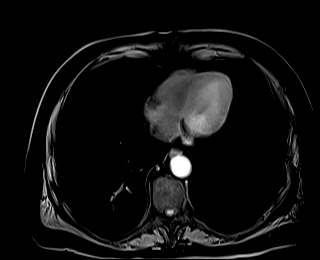

[Series 21: T1 dynamic · axial · 3.0mm · 1.25mm/px · z∈[-233,+4]mm · 3 of 80 slices shown (2 of 6)]
[im 1/80]
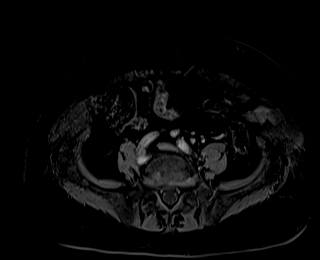
[im 40/80]
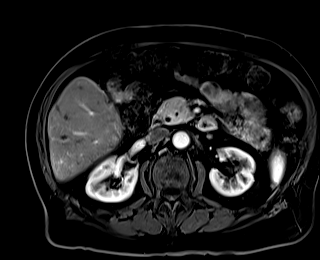
[im 80/80]
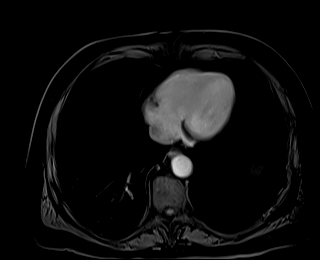

[Series 22: T1 dynamic · axial · 3.0mm · 1.25mm/px · z∈[-233,+4]mm · 3 of 80 slices shown (3 of 6)]
[im 1/80]
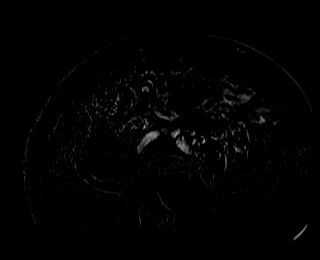
[im 40/80]
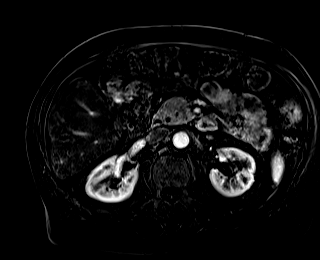
[im 80/80]
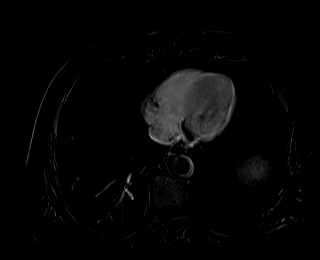

[Series 25: T1 dynamic · axial · 3.0mm · 1.25mm/px · z∈[-233,+4]mm · 3 of 80 slices shown (4 of 6)]
[im 1/80]
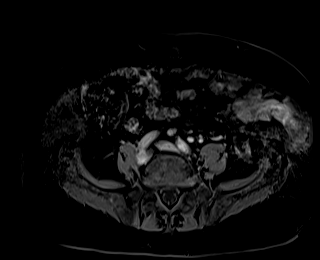
[im 40/80]
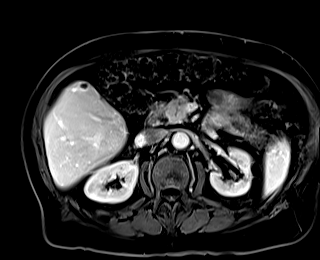
[im 80/80]
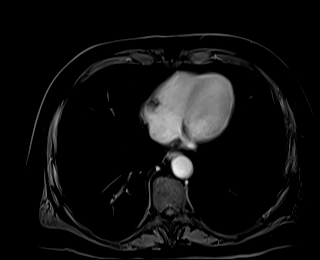

[Series 26: T1 dynamic · axial · 3.0mm · 1.25mm/px · z∈[-233,+4]mm · 3 of 80 slices shown (5 of 6)]
[im 1/80]
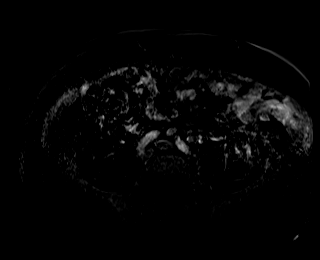
[im 40/80]
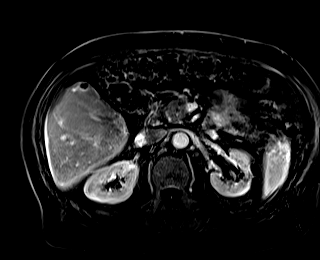
[im 80/80]
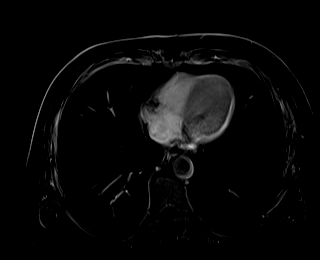

[Series 29: T1 dynamic · axial · 3.0mm · 1.25mm/px · 1 of 80 slices shown (6 of 6)]
[im 1/80]
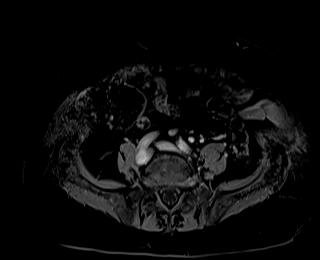

[33 of 48 positions shown; findings below may reference images not displayed]

FINDINGS: Lower chest: No acute findings.

Hepatobiliary: No mass or other parenchymal abnormality identified.
Status post cholecystectomy. No biliary ductal dilatation.

Pancreas: Unchanged fluid signal cystic lesion of the ventral
pancreatic tail measuring 1.4 cm, communicating to the main
pancreatic duct (series 3, image 12) and of the pancreatic uncinate
measuring 1.9 cm (series 15, image 26). No solid mass, inflammatory
changes, or other parenchymal abnormality identified. No pancreatic
ductal dilatation.

Spleen:  Within normal limits in size and appearance.

Adrenals/Urinary Tract: No masses identified. Fluid signal exophytic
cyst of the lateral midportion of the left kidney. No evidence of
hydronephrosis.

Stomach/Bowel: Visualized portions within the abdomen are
unremarkable.

Vascular/Lymphatic: No pathologically enlarged lymph nodes
identified. No abdominal aortic aneurysm demonstrated.

Other:  None.

Musculoskeletal: No suspicious bone lesions identified.
IMPRESSION: Unchanged fluid signal cystic lesion of the ventral pancreatic tail
measuring 1.4 cm, communicating to the main pancreatic duct and of
the pancreatic uncinate measuring 1.9 cm. These are most likely side
branch IPMNs. As significant initial stability has been established
and there is no observed increased risk of malignancy for such
lesions smaller than 2 cm, no further follow-up or characterization
is required.

## 2021-03-12 MED ORDER — GADOBUTROL 1 MMOL/ML IV SOLN
10.0000 mL | Freq: Once | INTRAVENOUS | Status: AC | PRN
  Administered 2021-03-12: 10 mL via INTRAVENOUS

## 2021-03-17 ENCOUNTER — Encounter: Payer: Self-pay | Admitting: Gastroenterology

## 2021-03-17 ENCOUNTER — Ambulatory Visit (AMBULATORY_SURGERY_CENTER): Payer: BC Managed Care – PPO | Admitting: Gastroenterology

## 2021-03-17 ENCOUNTER — Other Ambulatory Visit: Payer: Self-pay

## 2021-03-17 VITALS — BP 104/60 | HR 63 | Temp 98.0°F | Resp 17 | Ht 69.5 in | Wt 213.0 lb

## 2021-03-17 DIAGNOSIS — D125 Benign neoplasm of sigmoid colon: Secondary | ICD-10-CM | POA: Diagnosis not present

## 2021-03-17 DIAGNOSIS — K648 Other hemorrhoids: Secondary | ICD-10-CM | POA: Diagnosis not present

## 2021-03-17 DIAGNOSIS — D124 Benign neoplasm of descending colon: Secondary | ICD-10-CM | POA: Diagnosis not present

## 2021-03-17 DIAGNOSIS — D123 Benign neoplasm of transverse colon: Secondary | ICD-10-CM

## 2021-03-17 DIAGNOSIS — K862 Cyst of pancreas: Secondary | ICD-10-CM

## 2021-03-17 DIAGNOSIS — R933 Abnormal findings on diagnostic imaging of other parts of digestive tract: Secondary | ICD-10-CM

## 2021-03-17 DIAGNOSIS — D126 Benign neoplasm of colon, unspecified: Secondary | ICD-10-CM

## 2021-03-17 DIAGNOSIS — D128 Benign neoplasm of rectum: Secondary | ICD-10-CM | POA: Diagnosis not present

## 2021-03-17 DIAGNOSIS — K5732 Diverticulitis of large intestine without perforation or abscess without bleeding: Secondary | ICD-10-CM

## 2021-03-17 DIAGNOSIS — D122 Benign neoplasm of ascending colon: Secondary | ICD-10-CM

## 2021-03-17 MED ORDER — SODIUM CHLORIDE 0.9 % IV SOLN: 500.0000 mL | Freq: Once | INTRAVENOUS | Status: DC

## 2021-03-17 NOTE — Progress Notes (Signed)
Called to room to assist during endoscopic procedure.  Patient ID and intended procedure confirmed with present staff. Received instructions for my participation in the procedure from the performing physician.  

## 2021-03-17 NOTE — Op Note (Signed)
[facility] Patient Name: Paul Ibarra Procedure Date: 03/17/2021 8:23 AM MRN: [id number] Endoscopist: Nicki Reaper E. Candis Schatz , MD Age: 75 Referring MD:  Date of Birth: 11-Mar-1946 Gender: Male Account #: [id number] Procedure:                Colonoscopy Indications:              Follow-up of diverticulitis Medicines:                Monitored Anesthesia Care Procedure:                Pre-Anesthesia Assessment:                           - Prior to the procedure, a History and Physical                            was performed, and patient medications and                            allergies were reviewed. The patient's tolerance of                            previous anesthesia was also reviewed. The risks                            and benefits of the procedure and the sedation                            options and risks were discussed with the patient.                            All questions were answered, and informed consent                            was obtained. Prior Anticoagulants: The patient has                            taken no previous anticoagulant or antiplatelet                            agents. ASA Grade Assessment: III - A patient with                            severe systemic disease. After reviewing the risks                            and benefits, the patient was deemed in                            satisfactory condition to undergo the procedure.                           After obtaining informed consent, the colonoscope  was passed under direct vision. Throughout the                            procedure, the patient's blood pressure, pulse, and                            oxygen saturations were monitored continuously. The                            Colonoscope was introduced through the anus and                            advanced to the the terminal ileum, with                            identification of the appendiceal  orifice and IC                            valve. The colonoscopy was technically difficult                            and complex due to multiple diverticula in the                            colon, inadequate bowel prep and a tortuous colon.                            The patient tolerated the procedure well. The                            quality of the bowel preparation was inadequate.                            The terminal ileum, ileocecal valve, appendiceal                            orifice, and rectum were photographed. Scope In: 8:41:28 AM Scope Out: 9:51:17 AM Scope Withdrawal Time: 0 hours 54 minutes 38 seconds  Total Procedure Duration: 1 hour 9 minutes 49 seconds  Findings:                 Five sessile polyps were found in the ascending                            colon. The polyps were 4 to 8 mm in size. These                            polyps were removed with a cold snare. Resection                            and retrieval were complete. Estimated blood loss  was minimal.                           Four sessile polyps were found in the hepatic                            flexure. The polyps were 4 to 8 mm in size. These                            polyps were removed with a cold snare. Resection                            and retrieval were complete. Estimated blood loss                            was minimal.                           Four sessile polyps were found in the transverse                            colon. The polyps were 3 to 7 mm in size. These                            polyps were removed with a cold snare. Resection                            and retrieval were complete.                           Three sessile polyps were found in the descending                            colon. The polyps were 3 to 12 mm in size. These                            polyps were removed with a cold snare. Resection                            and retrieval  were complete. Estimated blood loss                            was minimal.                           A 5 mm polyp was found in the sigmoid colon. The                            polyp was sessile. The polyp was removed with a                            cold snare. Resection and retrieval were complete.  Estimated blood loss was minimal.                           A 5 mm polyp was found in the rectum. The polyp was                            sessile. The polyp was removed with a cold snare.                            Resection and retrieval were complete. Estimated                            blood loss was minimal.                           Multiple small and large-mouthed diverticula were                            found in the sigmoid colon and descending colon.                            There was significant luminal narrowing and                            angulation in the sigmoid colon. Visualization of                            the area was difficult and limited. Abnormal mucosa                            characterized by erythema, decreased vascularity                            and submucosal hemorrhage was noted in this region.                            A discrete mass was not seen. Biopsies were taken                            with a cold forceps for histology. Estimated blood                            loss was minimal.                           Non-bleeding internal hemorrhoids were found during                            retroflexion.                           The exam was otherwise normal throughout the  examined colon.                           No additional abnormalities were found on                            retroflexion. Complications:            No immediate complications. Estimated Blood Loss:     Estimated blood loss was minimal. Impression:               - Preparation of the colon was inadequate.                            - Five 4 to 8 mm polyps in the ascending colon,                            removed with a cold snare. Resected and retrieved.                           - Four 4 to 8 mm polyps at the hepatic flexure,                            removed with a cold snare. Resected and retrieved.                           - Four 3 to 7 mm polyps in the transverse colon,                            removed with a cold snare. Resected and retrieved.                           - Three 3 to 12 mm polyps in the descending colon,                            removed with a cold snare. Resected and retrieved.                           - One 5 mm polyp in the sigmoid colon, removed with                            a cold snare. Resected and retrieved.                           - One 5 mm polyp in the rectum, removed with a cold                            snare. Resected and retrieved.                           - Diverticulosis in the sigmoid colon and in the  descending colon. Biopsied.                           - Non-bleeding internal hemorrhoids. Recommendation:           - Patient has a contact number available for                            emergencies. The signs and symptoms of potential                            delayed complications were discussed with the                            patient. Return to normal activities tomorrow.                            Written discharge instructions were provided to the                            patient.                           - Resume previous diet.                           - Continue present medications.                           - Await pathology results.                           - Repeat colonoscopy in 1 year for surveillance. Erminie Foulks E. Candis Schatz, MD 03/17/2021 10:05:48 AM This report has been signed electronically.

## 2021-03-17 NOTE — Progress Notes (Signed)
HPI : 75 y/o male with CT imaging suggestive of complicated diverticulitis, without clinical history to support the diagnosis (no history of severe pain, n/v, fevers).   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    Pre-diabetes    Pure hypercholesterolemia      Past Surgical History:  Procedure Laterality Date   CATARACT EXTRACTION Bilateral    CHOLECYSTECTOMY     TONSILLECTOMY     Family History  Problem Relation Age of Onset   Diabetes Mother    Lung cancer Mother    Heart disease Father    Stroke Brother    Diabetes Brother    Asthma Daughter    Diabetes Daughter    Colon cancer Neg Hx    Esophageal cancer Neg Hx    Rectal cancer Neg Hx    Stomach cancer Neg Hx    Social History   Tobacco Use   Smoking status: Former    Packs/day: 1.00    Years: 50.00    Pack years: 50.00    Types: Cigarettes    Quit date: 03/10/2014    Years since quitting: 7.0   Smokeless tobacco: Never  Vaping Use   Vaping Use: Every day  Substance Use Topics   Alcohol use: Yes    Comment: 4-6 beers a week   Drug use: No   Current Outpatient Medications  Medication Sig Dispense Refill   albuterol (PROVENTIL HFA;VENTOLIN HFA) 108 (90 Base) MCG/ACT inhaler Inhale 2 puffs into the lungs every 6 (six) hours as needed for wheezing or shortness of breath. 1 Inhaler 0   Cholecalciferol (VITAMIN D) 50 MCG (2000 UT) CAPS Take 1 capsule by mouth daily.     lovastatin (MEVACOR) 10 MG tablet Take 10 mg by mouth at bedtime.     LUTEIN PO Take 1 tablet by mouth daily.     Multiple Vitamin (MULTIVITAMIN ADULT) TABS Take by mouth.     vitamin C (ASCORBIC ACID) 500 MG tablet Take 500 mg by mouth daily.      fluticasone (FLONASE) 50 MCG/ACT nasal spray Place 2 sprays into both nostrils daily. (Patient not taking: Reported on 03/17/2021)     Current Facility-Administered Medications  Medication Dose Route Frequency Provider Last Rate Last Admin   0.9 %  sodium chloride infusion  500 mL Intravenous Once Daryel November, MD       Allergies  Allergen Reactions   Sulfa Antibiotics      Review of Systems: All systems reviewed and negative except where noted in HPI.    MR 3D Recon At Scanner  Result Date: 03/12/2021 CLINICAL DATA:  Suspected pancreatic cystic lesions incidentally identified by prior CT EXAM: MRI ABDOMEN WITHOUT AND WITH CONTRAST (INCLUDING MRCP) TECHNIQUE: Multiplanar multisequence MR imaging of the abdomen was performed both before and after the administration of intravenous contrast. Heavily T2-weighted images of the biliary and pancreatic ducts were obtained, and three-dimensional MRCP images were rendered by post processing. CONTRAST:  70m GADAVIST GADOBUTROL 1 MMOL/ML IV SOLN COMPARISON:  CT abdomen pelvis, 01/10/2021, 06/09/2019 FINDINGS: Lower chest: No acute findings. Hepatobiliary: No mass or other parenchymal abnormality identified. Status post cholecystectomy.  No biliary ductal dilatation. Pancreas: Unchanged fluid signal cystic lesion of the ventral pancreatic tail measuring 1.4 cm, communicating to the main pancreatic duct (series 3, image 12) and of the pancreatic uncinate measuring 1.9 cm (series 15, image 26). No solid mass, inflammatory changes, or other parenchymal abnormality identified. No pancreatic ductal dilatation. Spleen:  Within normal limits in size and appearance. Adrenals/Urinary Tract: No masses identified. Fluid signal exophytic cyst of the lateral midportion of the left kidney. No evidence of hydronephrosis. Stomach/Bowel: Visualized portions within the abdomen are unremarkable. Vascular/Lymphatic: No pathologically enlarged lymph nodes identified. No  abdominal aortic aneurysm demonstrated. Other:  None. Musculoskeletal: No suspicious bone lesions identified. IMPRESSION: Unchanged fluid signal cystic lesion of the ventral pancreatic tail measuring 1.4 cm, communicating to the main pancreatic duct and of the pancreatic uncinate measuring 1.9 cm. 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 As significant initial stability has been established and there is no observed increased risk of malignancy for such lesions smaller than 2 cm, no further follow-up or characterization is required. Electronically Signed   By: Eddie Candle M.D.   On: 03/12/2021 15:43   MR ABDOMEN MRCP W WO CONTAST  Result Date: 03/12/2021 CLINICAL DATA:  Suspected pancreatic cystic lesions incidentally identified by prior CT EXAM: MRI ABDOMEN WITHOUT AND WITH CONTRAST (INCLUDING MRCP) TECHNIQUE: Multiplanar multisequence MR imaging of the abdomen was performed both before and after the administration of intravenous contrast. Heavily T2-weighted images of the biliary and pancreatic ducts were obtained, and three-dimensional MRCP images were rendered by post processing. CONTRAST:  41m GADAVIST GADOBUTROL 1 MMOL/ML IV SOLN COMPARISON:  CT abdomen pelvis, 01/10/2021, 06/09/2019 FINDINGS: Lower chest: No acute findings. Hepatobiliary: No mass or other parenchymal abnormality identified. Status post cholecystectomy. No biliary ductal dilatation. Pancreas: Unchanged fluid signal cystic lesion of the ventral pancreatic tail measuring 1.4 cm, communicating to the main pancreatic duct (series 3, image 12) and of the pancreatic uncinate measuring 1.9 cm (series 15, image 26). No solid mass, inflammatory changes, or other parenchymal abnormality identified. No pancreatic ductal dilatation. Spleen:  Within normal limits in size and appearance. Adrenals/Urinary Tract: No masses identified. Fluid signal exophytic cyst of the lateral midportion of the left kidney. No evidence of hydronephrosis. Stomach/Bowel:  Visualized portions within the abdomen are unremarkable. Vascular/Lymphatic: No pathologically enlarged lymph nodes identified. No abdominal aortic aneurysm demonstrated. Other:  None. Musculoskeletal: No suspicious bone lesions identified. IMPRESSION: Unchanged fluid signal cystic lesion of the ventral pancreatic tail measuring 1.4 cm, communicating to the main pancreatic duct and of the pancreatic uncinate measuring 1.9 cm. 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 As significant initial stability has been established and there is no observed increased risk of malignancy for such lesions smaller than 2 cm, no further follow-up or characterization is required. Electronically Signed   By: AEddie CandleM.D.   On: 03/12/2021 15:43    Physical Exam: BP 127/64   Pulse 77   Temp 98 F (36.7 C)   Ht 5' 9.5" (1.765 m)   Wt 213 lb (96.6 kg)   SpO2 94%   BMI 31.00 kg/m  Constitutional: Pleasant,well-developed, Caucasian male in no acute distress. HEENT: Normocephalic and atraumatic. Conjunctivae are normal. No scleral icterus. MS2 Neck supple.  Cardiovascular: Normal rate, regular rhythm.  Pulmonary/chest: Effort normal and breath sounds normal. No wheezing, rales or rhonchi. Abdominal: Soft, nondistended, nontender. Bowel sounds active throughout. There are no masses palpable. No hepatomegaly. Skin: Skin is warm and dry. No rashes noted. Psychiatric: Normal mood  and affect. Behavior is normal.  CBC    Component Value Date/Time   WBC 12.1 (A) 06/14/2012 1841   RBC 5.41 06/14/2012 1841   HGB 15.9 06/14/2012 1841   HCT 51.9 06/14/2012 1841   MCV 95.9 06/14/2012 1841   MCH 29.4 06/14/2012 1841   MCHC 30.6 (A) 06/14/2012 1841    CMP  No results found for: NA, K, CL, CO2, GLUCOSE, BUN, CREATININE, CALCIUM, PROT, ALBUMIN, AST, ALT, ALKPHOS, BILITOT, GFRNONAA, GFRAA   ASSESSMENT AND PLAN: 75 y/o m with CT suggestive of complicated left sided diverticulitis without clinical history to support  that diagnosis.  Colonoscopy to confirm diverticulosis and exclude other etiologies (mass, colitis). Daryel November, MD

## 2021-03-17 NOTE — Patient Instructions (Signed)
Await pathology  Next colonoscopy- 1 year  Please read over handouts about polyps, diverticulosis and hemorrhoids  Continue your normal medications  YOU HAD AN ENDOSCOPIC PROCEDURE TODAY AT [facility]:   Refer to the procedure report that was given to you for any specific questions about what was found during the examination.  If the procedure report does not answer your questions, please call your gastroenterologist to clarify.  If you requested that your care partner not be given the details of your procedure findings, then the procedure report has been included in a sealed envelope for you to review at your convenience later.  YOU SHOULD EXPECT: Some feelings of bloating in the abdomen. Passage of more gas than usual.  Walking can help get rid of the air that was put into your GI tract during the procedure and reduce the bloating. If you had a lower endoscopy (such as a colonoscopy or flexible sigmoidoscopy) you may notice spotting of blood in your stool or on the toilet paper. If you underwent a bowel prep for your procedure, you may not have a normal bowel movement for a few days.  Please Note:  You might notice some irritation and congestion in your nose or some drainage.  This is from the oxygen used during your procedure.  There is no need for concern and it should clear up in a day or so.  SYMPTOMS TO REPORT IMMEDIATELY:  Following lower endoscopy (colonoscopy or flexible sigmoidoscopy):  Excessive amounts of blood in the stool  Significant tenderness or worsening of abdominal pains  Swelling of the abdomen that is new, acute  Fever of 100F or higher  For urgent or emergent issues, a gastroenterologist can be reached at any hour by calling [phone]. Do not use MyChart messaging for urgent concerns.    DIET:  We do recommend a small meal at first, but then you may proceed to your regular diet.  Drink plenty of fluids but you should avoid alcoholic beverages  for 24 hours.  ACTIVITY:  You should plan to take it easy for the rest of today and you should NOT DRIVE or use heavy machinery until tomorrow (because of the sedation medicines used during the test).    FOLLOW UP: Our staff will call the number listed on your records 48-72 hours following your procedure to check on you and address any questions or concerns that you may have regarding the information given to you following your procedure. If we do not reach you, we will leave a message.  We will attempt to reach you two times.  During this call, we will ask if you have developed any symptoms of COVID 19. If you develop any symptoms (ie: fever, flu-like symptoms, shortness of breath, cough etc.) before then, please call [phone].  If you test positive for Covid 19 in the 2 weeks post procedure, please call and report this information to [place].    If any biopsies were taken you will be contacted by phone or by letter within the next 1-3 weeks.  Please call us at [phone] if you have not heard about the biopsies in 3 weeks.    SIGNATURES/CONFIDENTIALITY: You and/or your care partner have signed paperwork which will be entered into your electronic medical record.  These signatures attest to the fact that that the information above on your After Visit Summary has been reviewed and is understood.  Full responsibility of the confidentiality of this discharge information lies with you  and/or your care-partner.

## 2021-03-17 NOTE — Progress Notes (Signed)
pt tolerated well. VSS. awake and to recovery. Report given to RN.  

## 2021-03-18 NOTE — Progress Notes (Signed)
Mr. 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  Although our guidelines for cyst surveillance vary and are still being reassessed, I would recommend you consider a repeat MRI in 2 years.  We can discuss the risks and benefits of pancreatic cyst surveillance when the decision time is closer.

## 2021-03-19 ENCOUNTER — Telehealth: Payer: Self-pay | Admitting: *Deleted

## 2021-03-19 NOTE — Telephone Encounter (Signed)
  Follow up Call-  Call back number 03/17/2021  Post procedure Call Back phone  # [phone]  Permission to leave phone message Yes  Some recent data might be hidden     Patient questions:  Do you have a fever, pain , or abdominal swelling? No. Pain Score  0 *  Have you tolerated food without any problems? Yes.    Have you been able to return to your normal activities? Yes.    Do you have any questions about your discharge instructions: Diet   No. Medications  No. Follow up visit  No.  Do you have questions or concerns about your Care? Yes.    Actions: * If pain score is 4 or above: No action needed, pain <4.  Have you developed a fever since your procedure? no  2.   Have you had an respiratory symptoms (SOB or cough) since your procedure? no  3.   Have you tested positive for COVID 19 since your procedure no  4.   Have you had any family members/close contacts diagnosed with the COVID 19 since your procedure?  no   If yes to any of these questions please route to Joylene John, RN and Joella Prince, RN

## 2021-03-19 NOTE — Telephone Encounter (Signed)
Attempted f/u phone call. No answer. Left message. °

## 2021-03-24 ENCOUNTER — Encounter: Payer: Self-pay | Admitting: Gastroenterology

## 2022-05-12 ENCOUNTER — Telehealth: Payer: Self-pay

## 2022-05-12 NOTE — Telephone Encounter (Signed)
Called patient for 1 year recall screening colonoscopy due 03/2022.  NALM to call [phone] at earliest convenience and schedule.

## 2022-05-28 ENCOUNTER — Encounter: Payer: Self-pay | Admitting: Gastroenterology

## 2023-02-22 ENCOUNTER — Emergency Department (HOSPITAL_BASED_OUTPATIENT_CLINIC_OR_DEPARTMENT_OTHER): Payer: Medicare Other

## 2023-02-22 ENCOUNTER — Emergency Department (HOSPITAL_BASED_OUTPATIENT_CLINIC_OR_DEPARTMENT_OTHER)
Admission: EM | Admit: 2023-02-22 | Discharge: 2023-02-22 | Disposition: A | Discharge status: Home / Self Care | Payer: Medicare Other | Attending: Emergency Medicine | Admitting: Emergency Medicine

## 2023-02-22 ENCOUNTER — Other Ambulatory Visit: Payer: Self-pay

## 2023-02-22 DIAGNOSIS — W19XXXA Unspecified fall, initial encounter: Secondary | ICD-10-CM | POA: Diagnosis not present

## 2023-02-22 DIAGNOSIS — Y92009 Unspecified place in unspecified non-institutional (private) residence as the place of occurrence of the external cause: Secondary | ICD-10-CM | POA: Insufficient documentation

## 2023-02-22 DIAGNOSIS — S42202A Unspecified fracture of upper end of left humerus, initial encounter for closed fracture: Secondary | ICD-10-CM | POA: Diagnosis not present

## 2023-02-22 DIAGNOSIS — S4992XA Unspecified injury of left shoulder and upper arm, initial encounter: Secondary | ICD-10-CM | POA: Diagnosis present

## 2023-02-22 MED ORDER — OXYCODONE-ACETAMINOPHEN 5-325 MG PO TABS: 1.0000 | ORAL_TABLET | Freq: Three times a day (TID) | ORAL | 0 refills | Status: DC | PRN

## 2023-02-22 MED ORDER — SODIUM CHLORIDE 0.9 % IV BOLUS
1000.0000 mL | Freq: Once | INTRAVENOUS | Status: AC
  Administered 2023-02-22: 1000 mL via INTRAVENOUS

## 2023-02-22 MED ORDER — HYDROMORPHONE HCL 1 MG/ML IJ SOLN
0.5000 mg | Freq: Once | INTRAMUSCULAR | Status: AC
  Administered 2023-02-22: 0.5 mg via INTRAVENOUS
  Filled 2023-02-22: qty 1

## 2023-02-22 NOTE — Discharge Instructions (Addendum)
You have fallen and broken your shoulder.  It is immobilized now but may need surgery.  Follow-up with either Dr. Aundria Rud or Dr. Rennis Chris.  Try and get in later this week.

## 2023-02-22 NOTE — ED Notes (Signed)
Pt. L shoulder is edematous with abnormal look.  Pt. Has no ROM in the L shoulder due to pain and injury.  Pt. Had a fall today on concrete after working in the heat on his car.  Pt. Had been drinking beer today and no other liquid hydration.

## 2023-02-22 NOTE — ED Triage Notes (Signed)
POV from home, A&O x 4, GCS 15, BIB wheelchair  Approx 40 mins ago pt tripped and fell onto left shoulder and has left elbow pain with abrasions. Noticeable swelling and possible dislocation to shoulder. ROM very limited.

## 2023-02-22 NOTE — ED Provider Notes (Signed)
[place] [facility] Provider Note   CSN: [id number] Arrival date & time: 02/22/23  1857     History  Chief Complaint  Patient presents with   Shoulder Injury   Fall    Paul Ibarra is a 77 y.o. male.   Shoulder Injury  Fall  Patient tripped and fell at home landing on his left shoulder.  Left elbow and shoulder pain with deformity of the shoulder.  No loss consciousness.  States he just was off balance.  Patient's wife later states he may be dehydrated from working outside.  Not on blood thinners.  Did not hit head.     Home Medications Prior to Admission medications   Medication Sig Start Date End Date Taking? Authorizing Provider  oxyCODONE-acetaminophen (PERCOCET/ROXICET) 5-325 MG tablet Take 1-2 tablets by mouth every 8 (eight) hours as needed for severe pain. 02/22/23  Yes Benjiman Core, MD  albuterol (PROVENTIL HFA;VENTOLIN HFA) 108 (90 Base) MCG/ACT inhaler Inhale 2 puffs into the lungs every 6 (six) hours as needed for wheezing or shortness of breath. 10/19/15   Porfirio Oar, PA  Cholecalciferol (VITAMIN D) 50 MCG (2000 UT) CAPS Take 1 capsule by mouth daily.    [provider]  fluticasone (FLONASE) 50 MCG/ACT nasal spray Place 2 sprays into both nostrils daily. Patient not taking: Reported on 03/17/2021    [provider]  lovastatin (MEVACOR) 10 MG tablet Take 10 mg by mouth at bedtime.    [provider]  LUTEIN PO Take 1 tablet by mouth daily.    [provider]  Multiple Vitamin (MULTIVITAMIN ADULT) TABS Take by mouth.    [provider]  vitamin C (ASCORBIC ACID) 500 MG tablet Take 500 mg by mouth daily.    [provider]      Allergies    Sulfa antibiotics    Review of Systems   Review of Systems  Physical Exam Updated Vital Signs BP 132/79   Pulse 79   Temp 98 F (36.7 C) (Oral)   Resp 16   Ht 5\' 9"  (1.753 m)   Wt 95.3 kg   SpO2 98%   BMI 31.01  kg/m  Physical Exam Vitals and nursing note reviewed.  HENT:     Head: Atraumatic.  Musculoskeletal:        General: Tenderness present.     Cervical back: Neck supple. No tenderness.     Comments: Tenderness and deformity in left proximal humerus.  Some swelling.  No real tenderness over elbow.  Neurovascular intact in hand and radial pulse intact.  No chest tenderness.  No cervical spine tenderness.  Neurological:     Mental Status: He is alert and oriented to person, place, and time.     ED Results / Procedures / Treatments   Labs (all labs ordered are listed, but only abnormal results are displayed) Labs Reviewed - No data to display  EKG None  Radiology CT Shoulder Left Wo Contrast  Result Date: 02/22/2023 CLINICAL DATA:  Age-indeterminate left shoulder fracture EXAM: CT OF THE UPPER LEFT EXTREMITY WITHOUT CONTRAST TECHNIQUE: Multidetector CT imaging of the upper left extremity was performed according to the standard protocol. RADIATION DOSE REDUCTION: This exam was performed according to the departmental dose-optimization program which includes automated exposure control, adjustment of the mA and/or kV according to patient size and/or use of iterative reconstruction technique. COMPARISON:  None Available. FINDINGS: Bones/Joint/Cartilage There is an acute to subacute fracture of the surgical  neck of the left humerus with 3 cm override, 1 shaft with anterior displacement, and 90 degrees posterior angulation of the distal fracture fragment. The humeral head continues to articulate with the glenoid fossa. There is some cortication along the fracture margin of the humeral head best appreciated on axial image # 50/4 and sagittal image # 65/6 in keeping with a subacute age of this fracture. However, there is a more acute nondisplaced fracture plane identified within the lateral aspect of the humeral head best seen on axial image # 35/4 and coronal image # 63/5 in keeping with a more  hyperacute fracture. These fracture fragments appear in anatomic alignment. The visualized clavicle and scapula appear intact. Mild acromioclavicular degenerative arthritis. Glenohumeral joint space appears preserved. Visualized thoracic cage appears intact. Ligaments Suboptimally assessed by CT. Muscles and Tendons There is interstitial edema or hemorrhage surrounding the fracture plane tracking into the left upper extremity subjacent to the deltoid and around the short head of the biceps. No loculated intramuscular fluid collection. Mild calcific tendinitis involving the insertion of the rotator cuff upon the greater tuberosity. Soft tissues Indeterminate 6 mm pulmonary nodule is seen within the left upper lobe, axial image # 93/4 IMPRESSION: 1. Acute to subacute fracture of the surgical neck of the left humerus with 3 cm override, 1 shaft with anterior displacement, and 90 degrees posterior angulation of the distal fracture fragment. 2. Acute nondisplaced fracture plane within the lateral aspect of the humeral head. 3. Mild acromioclavicular degenerative arthritis. 4. Mild calcific tendinitis involving the insertion of the rotator cuff upon the greater tuberosity. 5. Solid pulmonary nodule measuring 6 mm. Comparison with prior examinations is recommended. If none are available, per Fleischner Society Guidelines, recommend a non-contrast Chest CT at 6-12 months. If patient is high risk for malignancy, consider an additional non-contrast Chest CT at 18-24 months. If patient is low risk for malignancy, non-contrast Chest CT at 18-24 months is optional. These guidelines do not apply to immunocompromised patients and patients with cancer. Follow up in patients with significant comorbidities as clinically warranted. For lung cancer screening, adhere to Lung-RADS guidelines. Reference: Radiology. 2017; 284(1):228-43. Electronically Signed   By: Helyn Numbers M.D.   On: 02/22/2023 20:52   DG Elbow Complete  Left  Result Date: 02/22/2023 CLINICAL DATA:  Fall, left elbow injury EXAM: LEFT ELBOW - COMPLETE 3+ VIEW COMPARISON:  None Available. FINDINGS: Normal alignment. No acute fracture or dislocation. Joint spaces are preserved. Tiny corticated density seen adjacent to the radial head may represent a tiny osteophyte or capsular calcification. No effusion. Mild soft tissue swelling superficial to the olecranon. IMPRESSION: 1. Mild soft tissue swelling. No acute fracture or dislocation. Electronically Signed   By: Helyn Numbers M.D.   On: 02/22/2023 19:57   DG Shoulder Left  Result Date: 02/22/2023 CLINICAL DATA:  Fall fall, left shoulder pain EXAM: LEFT SHOULDER - 2+ VIEW COMPARISON:  None Available. FINDINGS: There is an age indeterminate impacted fracture of the surgical neck of the left humeral head with a posterior rotatory deformity of the articular surface of the humeral head. There is superimposed inferior subluxation of the left humeral head which may relate to an underlying effusion. No frank dislocation. Scapula and clavicle appear intact. Limited evaluation of the left hemithorax is unremarkable. IMPRESSION: 1. Age indeterminate impacted fracture of the surgical neck of the left humeral head with a posterior rotatory deformity of the articular surface of the humeral head. Dedicated CT imaging would be helpful for further characterization.  2. Inferior subluxation of the left humeral head which may relate to an underlying effusion. Electronically Signed   By: Helyn Numbers M.D.   On: 02/22/2023 19:55    Procedures Procedures    Medications Ordered in ED Medications  sodium chloride 0.9 % bolus 1,000 mL (0 mLs Intravenous Stopped 02/22/23 2201)  HYDROmorphone (DILAUDID) injection 0.5 mg (0.5 mg Intravenous Given 02/22/23 2143)    ED Course/ Medical Decision Making/ A&P                             Medical Decision Making Amount and/or Complexity of Data Reviewed Radiology:  ordered.  Risk Prescription drug management.   Patient with fall.  Left shoulder deformity and pain.  Differential diagnosis includes fracture and dislocation.  X-ray independently interpreted does show apparent fracture.  Reviewed radiology read in the suggest CT scan.  Will get CT scan.  Also has mild hypotension.  Will give fluid bolus.  Reportedly has been outside today.  Patient states he does not feel lightheaded or dizzy.  Blood pressure improved.  CT scan done due to fractures.  Discussed with Dr. Constance Goltz.  Recommends following with either Dr. Rennis Chris or Dr. Aundria Rud.  Hopefully can get in later this week.  Will discharge with pain medicine.        Final Clinical Impression(s) / ED Diagnoses Final diagnoses:  Fall, initial encounter  Closed fracture of proximal end of left humerus, unspecified fracture morphology, initial encounter    Rx / DC Orders ED Discharge Orders          Ordered    oxyCODONE-acetaminophen (PERCOCET/ROXICET) 5-325 MG tablet  Every 8 hours PRN        02/22/23 2219              Benjiman Core, MD 02/22/23 2336

## 2023-03-08 NOTE — Patient Instructions (Signed)
SURGICAL WAITING ROOM VISITATION  Patients having surgery or a procedure may have no more than 2 support people in the waiting area - these visitors may rotate.    Children under the age of 53 must have an adult with them who is not the patient.  Due to an increase in RSV and influenza rates and associated hospitalizations, children ages 51 and under may not visit patients in [facility] hospitals.  If the patient needs to stay at the hospital during part of their recovery, the visitor guidelines for inpatient rooms apply. Pre-op nurse will coordinate an appropriate time for 1 support person to accompany patient in pre-op.  This support person may not rotate.    Please refer to the [facility] website for the visitor guidelines for Inpatients (after your surgery is over and you are in a regular room).    Your procedure is scheduled on: 03/11/23   Report to [facility] Main Entrance    Report to admitting at 7:30 AM   Call this number if you have problems the morning of surgery [phone]   Do not eat food :After Midnight.   After Midnight you may have the following liquids until 7:00 AM DAY OF SURGERY  Water Non-Citrus Juices (without pulp, NO RED-Apple, White grape, White cranberry) Black Coffee (NO MILK/CREAM OR CREAMERS, sugar ok)  Clear Tea (NO MILK/CREAM OR CREAMERS, sugar ok) regular and decaf                             Plain Jell-O (NO RED)                                           Fruit ices (not with fruit pulp, NO RED)                                     Popsicles (NO RED)                                                               Sports drinks like Gatorade (NO RED)                 The day of surgery:  Drink ONE (1) Pre-Surgery G2 at 7:00 AM the morning of surgery. Drink in one sitting. Do not sip.  This drink was given to you during your hospital  pre-op appointment visit. Nothing else to drink after completing the  Pre-Surgery G2.          If you  have questions, please contact your surgeon's office.   FOLLOW BOWEL PREP AND ANY ADDITIONAL PRE OP INSTRUCTIONS YOU RECEIVED FROM YOUR SURGEON'S OFFICE!!!     Oral Hygiene is also important to reduce your risk of infection.                                    Remember - BRUSH YOUR TEETH THE MORNING OF SURGERY WITH YOUR REGULAR TOOTHPASTE  DENTURES WILL BE REMOVED  PRIOR TO SURGERY PLEASE DO NOT APPLY "Poly grip" OR ADHESIVES!!!   Stop all vitamins and herbal supplements 7 days before surgery.   Take these medicines the morning of surgery with A SIP OF WATER: None                               You may not have any metal on your body including jewelry, and body piercing             Do not wear lotions, powders, cologne, or deodorant              Men may shave face and neck.   Do not bring valuables to the hospital. [facility] IS NOT             RESPONSIBLE   FOR VALUABLES.   Contacts, glasses, dentures or bridgework may not be worn into surgery.  DO NOT BRING YOUR HOME MEDICATIONS TO THE HOSPITAL. PHARMACY WILL DISPENSE MEDICATIONS LISTED ON YOUR MEDICATION LIST TO YOU DURING YOUR ADMISSION IN THE HOSPITAL!    Patients discharged on the day of surgery will not be allowed to drive home.  Someone NEEDS to stay with you for the first 24 hours after anesthesia.              Please read over the following fact sheets you were given: IF YOU HAVE QUESTIONS ABOUT YOUR PRE-OP INSTRUCTIONS PLEASE CALL [phone] Contras     Pre-operative 5 CHG Bath Instructions   You can play a key role in reducing the risk of infection after surgery. Your skin needs to be as free of germs as possible. You can reduce the number of germs on your skin by washing with CHG (chlorhexidine gluconate) soap before surgery. CHG is an antiseptic soap that kills germs and continues to kill germs even after washing.   DO NOT use if you have an allergy to chlorhexidine/CHG or antibacterial soaps. If your skin  becomes reddened or irritated, stop using the CHG and notify one of our RNs at [phone].   Please shower with the CHG soap starting 4 days before surgery using the following schedule:     Please keep in mind the following:  DO NOT shave, including legs and underarms, starting the day of your first shower.   You may shave your face at any point before/day of surgery.  Place clean sheets on your bed the day you start using CHG soap. Use a clean washcloth (not used since being washed) for each shower. DO NOT sleep with pets once you start using the CHG.   CHG Shower Instructions:  If you choose to wash your hair and private area, wash first with your normal shampoo/soap.  After you use shampoo/soap, rinse your hair and body thoroughly to remove shampoo/soap residue.  Turn the water OFF and apply about 3 tablespoons (45 ml) of CHG soap to a CLEAN washcloth.  Apply CHG soap ONLY FROM YOUR NECK DOWN TO YOUR TOES (washing for 3-5 minutes)  DO NOT use CHG soap on face, private areas, open wounds, or sores.  Pay special attention to the area where your surgery is being performed.  If you are having back surgery, having someone wash your back for you may be helpful. Wait 2 minutes after CHG soap is applied, then you may rinse off the CHG soap.  Pat dry with a clean towel  Put on clean clothes/pajamas  If you choose to wear lotion, please use ONLY the CHG-compatible lotions on the back of this paper.     Additional instructions for the day of surgery: DO NOT APPLY any lotions, deodorants, cologne, or perfumes.   Put on clean/comfortable clothes.  Brush your teeth.  Ask your nurse before applying any prescription medications to the skin.      CHG Compatible Lotions   Aveeno Moisturizing lotion  Cetaphil Moisturizing Cream  Cetaphil Moisturizing Lotion  Clairol Herbal Essence Moisturizing Lotion, Dry Skin  Clairol Herbal Essence Moisturizing Lotion, Extra Dry Skin  Clairol Herbal  Essence Moisturizing Lotion, Normal Skin  Curel Age Defying Therapeutic Moisturizing Lotion with Alpha Hydroxy  Curel Extreme Care Body Lotion  Curel Soothing Hands Moisturizing Hand Lotion  Curel Therapeutic Moisturizing Cream, Fragrance-Free  Curel Therapeutic Moisturizing Lotion, Fragrance-Free  Curel Therapeutic Moisturizing Lotion, Original Formula  Eucerin Daily Replenishing Lotion  Eucerin Dry Skin Therapy Plus Alpha Hydroxy Crme  Eucerin Dry Skin Therapy Plus Alpha Hydroxy Lotion  Eucerin Original Crme  Eucerin Original Lotion  Eucerin Plus Crme Eucerin Plus Lotion  Eucerin TriLipid Replenishing Lotion  Keri Anti-Bacterial Hand Lotion  Keri Deep Conditioning Original Lotion Dry Skin Formula Softly Scented  Keri Deep Conditioning Original Lotion, Fragrance Free Sensitive Skin Formula  Keri Lotion Fast Absorbing Fragrance Free Sensitive Skin Formula  Keri Lotion Fast Absorbing Softly Scented Dry Skin Formula  Keri Original Lotion  Keri Skin Renewal Lotion Keri Silky Smooth Lotion  Keri Silky Smooth Sensitive Skin Lotion  Nivea Body Creamy Conditioning Oil  Nivea Body Extra Enriched Lotion  Nivea Body Original Lotion  Nivea Body Sheer Moisturizing Lotion Nivea Crme  Nivea Skin Firming Lotion  NutraDerm 30 Skin Lotion  NutraDerm Skin Lotion  NutraDerm Therapeutic Skin Cream  NutraDerm Therapeutic Skin Lotion  ProShield Protective Hand Cream  Provon moisturizing lotion  PATIENT SIGNATURE_________________________________  NURSE SIGNATURE__________________________________  ________________________________________________________________________  Rogelia Mire  An incentive spirometer is a tool that can help keep your lungs clear and active. This tool measures how well you are filling your lungs with each breath. Taking long deep breaths may help reverse or decrease the chance of developing breathing (pulmonary) problems (especially infection) following: A long  period of time when you are unable to move or be active. BEFORE THE PROCEDURE  If the spirometer includes an indicator to show your best effort, your nurse or respiratory therapist will set it to a desired goal. If possible, sit up straight or lean slightly forward. Try not to slouch. Hold the incentive spirometer in an upright position. INSTRUCTIONS FOR USE  Sit on the edge of your bed if possible, or sit up as far as you can in bed or on a chair. Hold the incentive spirometer in an upright position. Breathe out normally. Place the mouthpiece in your mouth and seal your lips tightly around it. Breathe in slowly and as deeply as possible, raising the piston or the ball toward the top of the column. Hold your breath for 3-5 seconds or for as long as possible. Allow the piston or ball to fall to the bottom of the column. Remove the mouthpiece from your mouth and breathe out normally. Rest for a few seconds and repeat Steps 1 through 7 at least 10 times every 1-2 hours when you are awake. Take your time and take a few normal breaths between deep breaths. The spirometer may include an indicator to show your best effort. Use the indicator as a goal to work toward during  each repetition. After each set of 10 deep breaths, practice coughing to be sure your lungs are clear. If you have an incision (the cut made at the time of surgery), support your incision when coughing by placing a pillow or rolled up towels firmly against it. Once you are able to get out of bed, walk around indoors and cough well. You may stop using the incentive spirometer when instructed by your caregiver.  RISKS AND COMPLICATIONS Take your time so you do not get dizzy or light-headed. If you are in pain, you may need to take or ask for pain medication before doing incentive spirometry. It is harder to take a deep breath if you are having pain. AFTER USE Rest and breathe slowly and easily. It can be helpful to keep track of a log  of your progress. Your caregiver can provide you with a simple table to help with this. If you are using the spirometer at home, follow these instructions: SEEK MEDICAL CARE IF:  You are having difficultly using the spirometer. You have trouble using the spirometer as often as instructed. Your pain medication is not giving enough relief while using the spirometer. You develop fever of 100.5 F (38.1 C) or higher. SEEK IMMEDIATE MEDICAL CARE IF:  You cough up bloody sputum that had not been present before. You develop fever of 102 F (38.9 C) or greater. You develop worsening pain at or near the incision site. MAKE SURE YOU:  Understand these instructions. Will watch your condition. Will get help right away if you are not doing well or get worse. Document Released: 12/07/2006 Document Revised: 10/19/2011 Document Reviewed: 02/07/2007 ExitCare Patient Information 2014 [place].   ________________________________________________________________________ [facility]- Preparing for Total Shoulder Arthroplasty    Before surgery, you can play an important role. Because skin is not sterile, your skin needs to be as free of germs as possible. You can reduce the number of germs on your skin by using the following products. Benzoyl Peroxide Gel Reduces the number of germs present on the skin Applied twice a day to shoulder area starting two days before surgery    ==================================================================  Please follow these instructions carefully:  BENZOYL PEROXIDE 5% GEL  Please do not use if you have an allergy to benzoyl peroxide.   If your skin becomes reddened/irritated stop using the benzoyl peroxide.  Starting two days before surgery, apply as follows: Apply benzoyl peroxide in the morning and at night. Apply after taking a shower. If you are not taking a shower clean entire shoulder front, back, and side along with the armpit with a clean wet  washcloth.  Place a quarter-sized dollop on your shoulder and rub in thoroughly, making sure to cover the front, back, and side of your shoulder, along with the armpit.   2 days before ____ AM   ____ PM              1 day before ____ AM   ____ PM                         Do this twice a day for two days.  (Last application is the night before surgery, AFTER using the CHG soap as described below).  Do NOT apply benzoyl peroxide gel on the day of surgery.

## 2023-03-08 NOTE — Progress Notes (Addendum)
COVID Vaccine Completed: yes  Date of COVID positive in last 90 days: no  PCP - Joyce Gross, MD Cardiologist - n/a  Chest x-ray - n/a EKG -  Stress Test - long time ago per pt ECHO - n/a Cardiac Cath - n/a Pacemaker/ICD device last checked: n/a Spinal Cord Stimulator:n/a  Bowel Prep -   Sleep Study - n/a CPAP -   Fasting Blood Sugar - preDM, no meds 85 Checks Blood Sugar checks randomly  Last dose of GLP1 agonist-  N/A GLP1 instructions:  N/A   Last dose of SGLT-2 inhibitors-  N/A SGLT-2 instructions: N/A   Blood Thinner Instructions:  n/a Aspirin Instructions: Last Dose:  Activity level: Can go up a flight of stairs and perform activities of daily living without stopping and without symptoms of chest pain or shortness of breath.   Anesthesia review:   Patient denies shortness of breath, fever, cough and chest pain at PAT appointment  Patient verbalized understanding of instructions that were given to them at the PAT appointment. Patient was also instructed that they will need to review over the PAT instructions again at home before surgery.

## 2023-03-09 ENCOUNTER — Other Ambulatory Visit: Payer: Self-pay

## 2023-03-09 ENCOUNTER — Encounter (HOSPITAL_COMMUNITY): Payer: Self-pay

## 2023-03-09 ENCOUNTER — Encounter (HOSPITAL_COMMUNITY)
Admission: RE | Admit: 2023-03-09 | Discharge: 2023-03-09 | Disposition: A | Discharge status: Home / Self Care | Payer: Medicare Other | Source: Ambulatory Visit | Attending: Orthopedic Surgery | Admitting: Orthopedic Surgery

## 2023-03-09 VITALS — BP 140/72 | HR 73 | Temp 98.6°F | Resp 16 | Ht 71.0 in | Wt 214.0 lb

## 2023-03-09 DIAGNOSIS — Z01812 Encounter for preprocedural laboratory examination: Secondary | ICD-10-CM | POA: Insufficient documentation

## 2023-03-09 DIAGNOSIS — Z01818 Encounter for other preprocedural examination: Secondary | ICD-10-CM

## 2023-03-09 LAB — CBC
HCT: 39.8 % (ref 39.0–52.0)
Hemoglobin: 12.5 g/dL — ABNORMAL LOW (ref 13.0–17.0)
MCH: 28 pg (ref 26.0–34.0)
MCHC: 31.4 g/dL (ref 30.0–36.0)
MCV: 89.2 fL (ref 80.0–100.0)
Platelets: 277 10*3/uL (ref 150–400)
RBC: 4.46 MIL/uL (ref 4.22–5.81)
RDW: 13.4 % (ref 11.5–15.5)
WBC: 10.5 10*3/uL (ref 4.0–10.5)
nRBC: 0 % (ref 0.0–0.2)

## 2023-03-09 LAB — SURGICAL PCR SCREEN
MRSA, PCR: NEGATIVE
Staphylococcus aureus: NEGATIVE

## 2023-03-11 ENCOUNTER — Ambulatory Visit (HOSPITAL_COMMUNITY)
Admission: RE | Admit: 2023-03-11 | Discharge: 2023-03-11 | Disposition: A | Discharge status: Home / Self Care | Payer: Medicare Other | Attending: Orthopedic Surgery | Admitting: Orthopedic Surgery

## 2023-03-11 ENCOUNTER — Ambulatory Visit (HOSPITAL_BASED_OUTPATIENT_CLINIC_OR_DEPARTMENT_OTHER): Payer: Medicare Other | Admitting: Anesthesiology

## 2023-03-11 ENCOUNTER — Other Ambulatory Visit: Payer: Self-pay

## 2023-03-11 ENCOUNTER — Ambulatory Visit (HOSPITAL_COMMUNITY): Payer: Self-pay | Admitting: Physician Assistant

## 2023-03-11 ENCOUNTER — Encounter (HOSPITAL_COMMUNITY)
Admission: RE | Disposition: A | Discharge status: Home / Self Care | Payer: Self-pay | Source: Home / Self Care | Attending: Orthopedic Surgery

## 2023-03-11 ENCOUNTER — Encounter (HOSPITAL_COMMUNITY): Payer: Self-pay | Admitting: Orthopedic Surgery

## 2023-03-11 DIAGNOSIS — W19XXXA Unspecified fall, initial encounter: Secondary | ICD-10-CM | POA: Insufficient documentation

## 2023-03-11 DIAGNOSIS — S42292A Other displaced fracture of upper end of left humerus, initial encounter for closed fracture: Secondary | ICD-10-CM | POA: Diagnosis not present

## 2023-03-11 DIAGNOSIS — S42202A Unspecified fracture of upper end of left humerus, initial encounter for closed fracture: Secondary | ICD-10-CM | POA: Insufficient documentation

## 2023-03-11 DIAGNOSIS — J449 Chronic obstructive pulmonary disease, unspecified: Secondary | ICD-10-CM | POA: Diagnosis not present

## 2023-03-11 DIAGNOSIS — Z87891 Personal history of nicotine dependence: Secondary | ICD-10-CM | POA: Diagnosis not present

## 2023-03-11 HISTORY — PX: REVERSE SHOULDER ARTHROPLASTY: SHX5054

## 2023-03-11 SURGERY — ARTHROPLASTY, SHOULDER, TOTAL, REVERSE
Anesthesia: General | Site: Shoulder | Laterality: Left

## 2023-03-11 MED ORDER — ORAL CARE MOUTH RINSE: 15.0000 mL | Freq: Once | OROMUCOSAL | Status: AC

## 2023-03-11 MED ORDER — ONDANSETRON HCL 4 MG PO TABS: 4.0000 mg | ORAL_TABLET | Freq: Three times a day (TID) | ORAL | 0 refills | Status: AC | PRN

## 2023-03-11 MED ORDER — PROPOFOL 10 MG/ML IV BOLUS
INTRAVENOUS | Status: AC
  Filled 2023-03-11: qty 20

## 2023-03-11 MED ORDER — PHENYLEPHRINE HCL-NACL 20-0.9 MG/250ML-% IV SOLN
INTRAVENOUS | Status: DC | PRN
  Administered 2023-03-11: 60 ug/min via INTRAVENOUS

## 2023-03-11 MED ORDER — CYCLOBENZAPRINE HCL 10 MG PO TABS: 10.0000 mg | ORAL_TABLET | Freq: Three times a day (TID) | ORAL | 1 refills | Status: AC | PRN

## 2023-03-11 MED ORDER — FENTANYL CITRATE PF 50 MCG/ML IJ SOSY: 100.0000 ug | PREFILLED_SYRINGE | Freq: Once | INTRAMUSCULAR | Status: DC

## 2023-03-11 MED ORDER — EPHEDRINE 5 MG/ML INJ
INTRAVENOUS | Status: AC
  Filled 2023-03-11: qty 5

## 2023-03-11 MED ORDER — PHENYLEPHRINE HCL (PRESSORS) 10 MG/ML IV SOLN
INTRAVENOUS | Status: AC
  Filled 2023-03-11: qty 1

## 2023-03-11 MED ORDER — OXYCODONE HCL 5 MG PO TABS: 5.0000 mg | ORAL_TABLET | Freq: Once | ORAL | Status: DC | PRN

## 2023-03-11 MED ORDER — ONDANSETRON HCL 4 MG/2ML IJ SOLN: 4.0000 mg | Freq: Once | INTRAMUSCULAR | Status: DC | PRN

## 2023-03-11 MED ORDER — LACTATED RINGERS IV SOLN: INTRAVENOUS | Status: DC

## 2023-03-11 MED ORDER — PHENYLEPHRINE 80 MCG/ML (10ML) SYRINGE FOR IV PUSH (FOR BLOOD PRESSURE SUPPORT)
PREFILLED_SYRINGE | INTRAVENOUS | Status: AC
  Filled 2023-03-11: qty 10

## 2023-03-11 MED ORDER — FENTANYL CITRATE PF 50 MCG/ML IJ SOSY: 25.0000 ug | PREFILLED_SYRINGE | INTRAMUSCULAR | Status: DC | PRN

## 2023-03-11 MED ORDER — BUPIVACAINE HCL (PF) 0.5 % IJ SOLN
INTRAMUSCULAR | Status: DC | PRN
  Administered 2023-03-11: 10 mL

## 2023-03-11 MED ORDER — TRANEXAMIC ACID 1000 MG/10ML IV SOLN: 1000.0000 mg | INTRAVENOUS | Status: DC

## 2023-03-11 MED ORDER — GLYCOPYRROLATE 0.2 MG/ML IJ SOLN
INTRAMUSCULAR | Status: DC | PRN
  Administered 2023-03-11: .2 mg via INTRAVENOUS

## 2023-03-11 MED ORDER — OXYCODONE HCL 5 MG/5ML PO SOLN: 5.0000 mg | Freq: Once | ORAL | Status: DC | PRN

## 2023-03-11 MED ORDER — 0.9 % SODIUM CHLORIDE (POUR BTL) OPTIME
TOPICAL | Status: DC | PRN
  Administered 2023-03-11: 1000 mL

## 2023-03-11 MED ORDER — ROCURONIUM BROMIDE 100 MG/10ML IV SOLN
INTRAVENOUS | Status: DC | PRN
  Administered 2023-03-11: 80 mg via INTRAVENOUS

## 2023-03-11 MED ORDER — VANCOMYCIN HCL 1000 MG IV SOLR
INTRAVENOUS | Status: AC
  Filled 2023-03-11: qty 20

## 2023-03-11 MED ORDER — BUPIVACAINE LIPOSOME 1.3 % IJ SUSP
INTRAMUSCULAR | Status: DC | PRN
  Administered 2023-03-11: 10 mL

## 2023-03-11 MED ORDER — CHLORHEXIDINE GLUCONATE 0.12 % MT SOLN
15.0000 mL | Freq: Once | OROMUCOSAL | Status: AC
  Administered 2023-03-11: 15 mL via OROMUCOSAL

## 2023-03-11 MED ORDER — OXYCODONE-ACETAMINOPHEN 5-325 MG PO TABS: 1.0000 | ORAL_TABLET | Freq: Four times a day (QID) | ORAL | 0 refills | Status: AC | PRN

## 2023-03-11 MED ORDER — STERILE WATER FOR IRRIGATION IR SOLN
Status: DC | PRN
  Administered 2023-03-11: 2000 mL

## 2023-03-11 MED ORDER — SUGAMMADEX SODIUM 200 MG/2ML IV SOLN
INTRAVENOUS | Status: DC | PRN
  Administered 2023-03-11: 200 mg via INTRAVENOUS

## 2023-03-11 MED ORDER — PROPOFOL 10 MG/ML IV BOLUS
INTRAVENOUS | Status: DC | PRN
  Administered 2023-03-11: 150 mg via INTRAVENOUS

## 2023-03-11 MED ORDER — ROCURONIUM BROMIDE 10 MG/ML (PF) SYRINGE
PREFILLED_SYRINGE | INTRAVENOUS | Status: AC
  Filled 2023-03-11: qty 10

## 2023-03-11 MED ORDER — EPHEDRINE SULFATE (PRESSORS) 50 MG/ML IJ SOLN
INTRAMUSCULAR | Status: DC | PRN
Start: 2023-03-11 — End: 2023-03-11
  Administered 2023-03-11: 10 mg via INTRAVENOUS
  Administered 2023-03-11: 5 mg via INTRAVENOUS

## 2023-03-11 MED ORDER — TRANEXAMIC ACID-NACL 1000-0.7 MG/100ML-% IV SOLN
1000.0000 mg | INTRAVENOUS | Status: AC
  Administered 2023-03-11: 1000 mg via INTRAVENOUS
  Filled 2023-03-11: qty 100

## 2023-03-11 MED ORDER — CEFAZOLIN SODIUM-DEXTROSE 2-4 GM/100ML-% IV SOLN
2.0000 g | INTRAVENOUS | Status: AC
  Administered 2023-03-11: 2 g via INTRAVENOUS
  Filled 2023-03-11: qty 100

## 2023-03-11 MED ORDER — PHENYLEPHRINE HCL (PRESSORS) 10 MG/ML IV SOLN
INTRAVENOUS | Status: DC | PRN
  Administered 2023-03-11 (×3): 80 ug via INTRAVENOUS
  Administered 2023-03-11 (×2): 160 ug via INTRAVENOUS
  Administered 2023-03-11 (×3): 80 ug via INTRAVENOUS

## 2023-03-11 MED ORDER — ACETAMINOPHEN 10 MG/ML IV SOLN: 1000.0000 mg | Freq: Once | INTRAVENOUS | Status: DC | PRN

## 2023-03-11 MED ORDER — MIDAZOLAM HCL 2 MG/2ML IJ SOLN: 2.0000 mg | Freq: Once | INTRAMUSCULAR | Status: DC

## 2023-03-11 MED ORDER — ONDANSETRON HCL 4 MG/2ML IJ SOLN
INTRAMUSCULAR | Status: DC | PRN
  Administered 2023-03-11: 4 mg via INTRAVENOUS

## 2023-03-11 SURGICAL SUPPLY — 73 items
ADH SKN CLS APL DERMABOND .7 (GAUZE/BANDAGES/DRESSINGS) ×1
ADH SKN CLS LQ APL DERMABOND (GAUZE/BANDAGES/DRESSINGS) ×1
AID PSTN UNV HD RSTRNT DISP (MISCELLANEOUS) ×1
BAG COUNTER SPONGE SURGICOUNT (BAG) IMPLANT
BAG SPEC THK2 15X12 ZIP CLS (MISCELLANEOUS) ×1
BAG SPNG CNTER NS LX DISP (BAG)
BAG ZIPLOCK 12X15 (MISCELLANEOUS) ×1 IMPLANT
BIT DRILL AR 3 NS (BIT) IMPLANT
BLADE SAW SGTL 83.5X18.5 (BLADE) ×1 IMPLANT
BNDG CMPR 5X4 CHSV STRCH STRL (GAUZE/BANDAGES/DRESSINGS) ×1
BNDG COHESIVE 4X5 TAN STRL LF (GAUZE/BANDAGES/DRESSINGS) ×1 IMPLANT
BSPLAT GLND +2X24 MDLR (Joint) ×1 IMPLANT
CLSR STERI-STRIP ANTIMIC 1/2X4 (GAUZE/BANDAGES/DRESSINGS) IMPLANT
COOLER ICEMAN CLASSIC (MISCELLANEOUS) ×1 IMPLANT
COVER BACK TABLE 60X90IN (DRAPES) ×1 IMPLANT
COVER SURGICAL LIGHT HANDLE (MISCELLANEOUS) ×1 IMPLANT
CUP SUT UNIV REVERS 39 NEU (Shoulder) IMPLANT
DERMABOND ADVANCED .7 DNX12 (GAUZE/BANDAGES/DRESSINGS) ×1 IMPLANT
DERMABOND ADVANCED .7 DNX6 (GAUZE/BANDAGES/DRESSINGS) IMPLANT
DRAPE ORTHO SPLIT 77X108 STRL (DRAPES) ×2
DRAPE SHEET LG 3/4 BI-LAMINATE (DRAPES) ×1 IMPLANT
DRAPE SURG 17X11 SM STRL (DRAPES) ×1 IMPLANT
DRAPE SURG ORHT 6 SPLT 77X108 (DRAPES) ×2 IMPLANT
DRAPE TOP 10253 STERILE (DRAPES) ×1 IMPLANT
DRAPE U-SHAPE 47X51 STRL (DRAPES) ×1 IMPLANT
DRESSING AQUACEL AG SP 3.5X6 (GAUZE/BANDAGES/DRESSINGS) ×1 IMPLANT
DRSG AQUACEL AG ADV 3.5X 6 (GAUZE/BANDAGES/DRESSINGS) IMPLANT
DRSG AQUACEL AG ADV 3.5X10 (GAUZE/BANDAGES/DRESSINGS) IMPLANT
DRSG AQUACEL AG SP 3.5X6 (GAUZE/BANDAGES/DRESSINGS) ×1
DURAPREP 26ML APPLICATOR (WOUND CARE) ×1 IMPLANT
ELECT BLADE TIP CTD 4 INCH (ELECTRODE) ×1 IMPLANT
ELECT PENCIL ROCKER SW 15FT (MISCELLANEOUS) ×1 IMPLANT
ELECT REM PT RETURN 15FT ADLT (MISCELLANEOUS) ×1 IMPLANT
FACESHIELD WRAPAROUND (MASK) ×5
FACESHIELD WRAPAROUND OR TEAM (MASK) ×5 IMPLANT
FIBERTAPE CERCLAGE TLINK SUT (SUTURE) IMPLANT
GLENOID UNI REV MOD 24 +2 LAT (Joint) IMPLANT
GLENOSPHERE 39+4 LAT/24 UNI RV (Joint) IMPLANT
GLOVE BIO SURGEON STRL SZ7.5 (GLOVE) ×1 IMPLANT
GLOVE BIO SURGEON STRL SZ8 (GLOVE) ×1 IMPLANT
GLOVE SS BIOGEL STRL SZ 7 (GLOVE) ×1 IMPLANT
GLOVE SS BIOGEL STRL SZ 7.5 (GLOVE) ×1 IMPLANT
GOWN STRL SURGICAL XL XLNG (GOWN DISPOSABLE) ×2 IMPLANT
INSERT HUMERAL MED 39/ +3 (Shoulder) IMPLANT
KIT BASIN OR (CUSTOM PROCEDURE TRAY) ×1 IMPLANT
KIT TURNOVER KIT A (KITS) IMPLANT
MANIFOLD NEPTUNE II (INSTRUMENTS) ×1 IMPLANT
NDL TAPERED W/ NITINOL LOOP (MISCELLANEOUS) ×1 IMPLANT
NEEDLE TAPERED W/ NITINOL LOOP (MISCELLANEOUS) ×1
NS IRRIG 1000ML POUR BTL (IV SOLUTION) ×1 IMPLANT
PACK SHOULDER (CUSTOM PROCEDURE TRAY) ×1 IMPLANT
PAD ARMBOARD 7.5X6 YLW CONV (MISCELLANEOUS) ×1 IMPLANT
PAD COLD SHLDR WRAP-ON (PAD) ×1 IMPLANT
PIN NITINOL TARGETER 2.8 (PIN) IMPLANT
PIN SET MODULAR GLENOID SYSTEM (PIN) IMPLANT
RESTRAINT HEAD UNIVERSAL NS (MISCELLANEOUS) ×1 IMPLANT
SCREW CENTRAL MOD 30MM (Screw) IMPLANT
SCREW PERI LOCK 5.5X36 (Screw) IMPLANT
SCREW PERIPHERAL 5.5X20 LOCK (Screw) IMPLANT
SCREW PERIPHERAL 5.5X40 LOCK (Screw) IMPLANT
SLING ARM FOAM STRAP LRG (SOFTGOODS) IMPLANT
SLING ARM FOAM STRAP MED (SOFTGOODS) IMPLANT
STEM HUM UNIV REV SZ13 (Stem) IMPLANT
SUT MNCRL AB 3-0 PS2 18 (SUTURE) ×1 IMPLANT
SUT MON AB 2-0 CT1 36 (SUTURE) ×1 IMPLANT
SUT VIC AB 1 CT1 36 (SUTURE) ×1 IMPLANT
SUTURE TAPE 1.3 40 TPR END (SUTURE) ×2 IMPLANT
SUTURETAPE 1.3 40 TPR END (SUTURE) ×2
TOWEL OR 17X26 10 PK STRL BLUE (TOWEL DISPOSABLE) ×1 IMPLANT
TOWEL OR NON WOVEN STRL DISP B (DISPOSABLE) ×1 IMPLANT
TUBE SUCTION HIGH CAP CLEAR NV (SUCTIONS) ×1 IMPLANT
TUBING CONNECTING 10 (TUBING) ×1 IMPLANT
WATER STERILE IRR 1000ML POUR (IV SOLUTION) ×2 IMPLANT

## 2023-03-11 NOTE — Anesthesia Postprocedure Evaluation (Signed)
Anesthesia Post Note  Patient: Paul Ibarra  Procedure(s) Performed: REVERSE SHOULDER ARTHROPLASTY (Left: Shoulder)     Patient location during evaluation: PACU Anesthesia Type: General Level of consciousness: awake and alert Pain management: pain level controlled Vital Signs Assessment: post-procedure vital signs reviewed and stable Respiratory status: spontaneous breathing, nonlabored ventilation, respiratory function stable and patient connected to nasal cannula oxygen Cardiovascular status: blood pressure returned to baseline and stable Postop Assessment: no apparent nausea or vomiting Anesthetic complications: no   No notable events documented.  Last Vitals:  Vitals:   03/11/23 1210 03/11/23 1222  BP:  115/68  Pulse: 85 81  Resp: 20 20  Temp:  (!) 36.4 C  SpO2: 95% 94%    Last Pain:  Vitals:   03/11/23 1222  TempSrc: Temporal  PainSc: 0-No pain                 Mariann Barter

## 2023-03-11 NOTE — Evaluation (Signed)
Occupational Therapy Evaluation Patient Details Name: Paul Ibarra MRN: [id number] DOB: 01-13-1946 Today's Date: 03/11/2023   History of Present Illness Paul Ibarra is a 77 yr old male who is s/p a L shoulder reverse arthroplasty on 03-11-23, due to a displaced proximal humerus fracture.   Clinical Impression   Pt is s/p shoulder replacement 03-11-23 with decreased functional use of left non-dominant upper extremity. Therapist provided education and instruction to patient, spouse, and pt's daughter with regards to LUE ROM/exercises, post-op precautions, UE positioning, donning upper extremity clothing, recommendations for bathing while maintaining shoulder precautions, use of ice for pain and edema management, correct use of ice machine, and correctly donning/doffing sling. Patient and family verbalized and demonstrated understanding as needed. Patient needed assistance to donn shirt, underwear, pants, socks and shoes, with instruction on compensatory strategies to perform ADLs. Patient to follow up with MD for further therapy needs.        Recommendations for follow up therapy are one component of a multi-disciplinary discharge planning process, led by the attending physician.  Recommendations may be updated based on patient status, additional functional criteria and insurance authorization.   Assistance Recommended at Discharge Intermittent Supervision/Assistance  Patient can return home with the following A little help with bathing/dressing/bathroom;Assistance with cooking/housework;Assist for transportation;Help with stairs or ramp for entrance    Functional Status Assessment  Patient has had a recent decline in their functional status and demonstrates the ability to make significant improvements in function in a reasonable and predictable amount of time.  Equipment Recommendations  None recommended by OT    Recommendations for Other Services       Precautions / Restrictions  Precautions Precautions: Shoulder Type of Shoulder Precautions: sling at all times except ADLs/exercise Shoulder Interventions: Shoulder sling/immobilizer Precaution Booklet Issued: Yes (comment) Required Braces or Orthoses: Sling Restrictions Weight Bearing Restrictions: Yes LUE Weight Bearing: Non weight bearing Other Position/Activity Restrictions: PROM 10 ER, 45 ABD,60 FE , PASSIVE ROM FOR ADL's ONLY, NOT for EXERCISES. OK to exercise elbow wrist and hand rom and for edema control   No pendulums, may allow arm to dangle   Pt may shower      Mobility Bed Mobility        General bed mobility comments: pt was seated in the bedside chair    Transfers Overall transfer level: Needs assistance Equipment used: None Transfers: Sit to/from Stand Sit to Stand: Min assist                  Balance Overall balance assessment: Mild deficits observed, not formally tested         ADL either performed or assessed with clinical judgement      Vision Baseline Vision/History: 1 Wears glasses              Pertinent Vitals/Pain Pain Assessment Pain Assessment: No/denies pain     Hand Dominance Right      Communication Communication Communication: No difficulties   Cognition Arousal/Alertness: Awake/alert Behavior During Therapy: WFL for tasks assessed/performed Overall Cognitive Status: Within Functional Limits for tasks assessed        General Comments: Oriented x4, able to follow commands           Shoulder Instructions Shoulder Instructions Donning/doffing shirt without moving shoulder: Caregiver independent with task Method for sponge bathing under operated UE: Caregiver independent with task Donning/doffing sling/immobilizer: Minimal assistance (with caregiver/spouse performing) Correct positioning of sling/immobilizer: Minimal assistance (with caregiver/spouse performing) Pendulum exercises (written home exercise program):  (  N/A) ROM for elbow, wrist  and digits of operated UE: Caregiver independent with task Sling wearing schedule (on at all times/off for ADL's): Caregiver independent with task Proper positioning of operated UE when showering: Caregiver independent with task Dressing change:  (N/A) Positioning of UE while sleeping: Caregiver independent with task    Home Living Family/patient expects to be discharged to:: Private residence Living Arrangements: Spouse/significant other and daughter Available Help at Discharge: Family Type of Home: House       Home Layout: Two level;Able to live on main level with bedroom/bathroom Alternate Level Stairs-Number of Steps: his bedroom is on the main level of the home   Bathroom Shower/Tub: Walk-in shower         Home Equipment: Agricultural consultant (2 wheels);Cane - single point;BSC/3in1;Hand held shower head;Grab bars - tub/shower   Additional Comments: also has a lift chair and knee scooter      Prior Functioning/Environment Prior Level of Function : Independent/Modified Independent;Driving             Mobility Comments: Independent with ambulation. ADLs Comments: Independent with ADLs and driving; his spouse managed most of the cooking & cleaning.        OT Problem List: Impaired UE functional use            OT Frequency:  N/A       AM-PAC OT "6 Clicks" Daily Activity     Outcome Measure Help from another person eating meals?: None Help from another person taking care of personal grooming?: A Little Help from another person toileting, which includes using toliet, bedpan, or urinal?: A Little Help from another person bathing (including washing, rinsing, drying)?: A Little Help from another person to put on and taking off regular upper body clothing?: A Little Help from another person to put on and taking off regular lower body clothing?: A Little 6 Click Score: 19   End of Session Equipment Utilized During Treatment:  (N/A) Nurse Communication: Other (comment)  (shoulder education completed)  Activity Tolerance: Patient tolerated treatment well Patient left: with family/visitor present;with call bell/phone within reach  OT Visit Diagnosis: Muscle weakness (generalized) (M62.81)                Time: 8469-6295 OT Time Calculation (min): 30 min Charges:  OT General Charges $OT Visit: 1 Visit OT Evaluation $OT Eval Low Complexity: 1 Low OT Treatments $Self Care/Home Management : 8-22 mins    Reuben Likes, OTR/L 03/11/2023, 1:58 PM

## 2023-03-11 NOTE — H&P (Signed)
Paul Ibarra    Chief Complaint: Left displaced proximal humerus fracture HPI: The patient is a 77 y.o. male status post mechanical fall with severely displaced left proximal humerus fracture.  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 His smoking use included cigarettes. He started smoking about 59 years ago. He has a 50 pack-year smoking history. He has never used smokeless tobacco. He reports current alcohol use. He reports that he does not use drugs.  BMI: Estimated body mass index is 29.85 kg/m as calculated from the following:   Height as of 03/09/23: 5\' 11"  (1.803 m).   Weight as of 03/09/23: 97.1 kg.  No results found for: "ALBUMIN" Diabetes: Patient does not have a diagnosis of diabetes.     Smoking Status:       No medications prior to admission.     Physical Exam: Left shoulder demonstrates diffuse swelling as well as tenderness to palpation.  Severe pain with attempts at motion.  He is otherwise neurovascular intact left upper extremity.  Radiographs  Plain  films of the left shoulder confirm a severely displaced left proximal humerus fracture.  Vitals     Assessment/Plan  Impression: Left displaced proximal humerus fracture  Plan of Action: Procedure(s): REVERSE SHOULDER ARTHROPLASTY  Thornton Dohrmann M Tyvion Edmondson 03/11/2023, 6:35 AM Contact # [phone]

## 2023-03-11 NOTE — Discharge Instructions (Signed)
[name], M.D., F.A.A.O.S. Orthopa[facility] Specializing in Arthroscopic and Reconstructive Surgery of the Shoulder [phone] [address] [place], [place] [address] - Fax [phone]   POST-OP TOTAL SHOULDER REPLACEMENT INSTRUCTIONS  1. Follow up in the office for your first post-op appointment 10-14 days from the date of your surgery. If you do not already have a scheduled appointment, our office will contact you to schedule.  2. The bandage over your incision is waterproof. You may begin showering with this dressing on. You may leave this dressing on until first follow up appointment within 2 weeks. We prefer you leave this dressing in place until follow up however after 5-7 days if you are having itching or skin irritation and would like to remove it you may do so. Go slow and tug at the borders gently to break the bond the dressing has with the skin. At this point if there is no drainage it is okay to go without a bandage or you may cover it with a light guaze and tape. You can also expect significant bruising around your shoulder that will drift down your arm and into your chest wall. This is very normal and should resolve over several days.   3. Wear your sling/immobilizer at all times except to perform the exercises below or to occasionally let your arm dangle by your side to stretch your elbow. You also need to sleep in your sling immobilizer until instructed otherwise. It is ok to remove your sling if you are sitting in a controlled environment and allow your arm to rest in a position of comfort by your side or on your lap with pillows to give your neck and skin a break from the sling. You may remove it to allow arm to dangle by side to shower. If you are up walking around and when you go to sleep at night you need to wear it.  4. Range of motion to your elbow, wrist, and hand are encouraged 3-5 times daily. Exercise to your hand and fingers helps to reduce  swelling you may experience.   5. Prescriptions for a pain medication and a muscle relaxant are provided for you. It is recommended that if you are experiencing pain that you pain medication alone is not controlling, add the muscle relaxant along with the pain medication which can give additional pain relief. The first 1-2 days is generally the most severe of your pain and then should gradually decrease. As your pain lessens it is recommended that you decrease your use of the pain medications to an "as needed basis'" only and to always comply with the recommended dosages of the pain medications.  6. Pain medications can produce constipation along with their use. If you experience this, the use of an over the counter stool softener or laxative daily is recommended.   7. For additional questions or concerns, please do not hesitate to call the office. If after hours there is an answering service to forward your concerns to the physician on call.  8.Pain control following an exparel block  To help control your post-operative pain you received a nerve block  performed with Exparel which is a long acting anesthetic (numbing agent) which can provide pain relief and sensations of numbness (and relief of pain) in the operative shoulder and arm for up to 3 days. Sometimes it provides mixed relief, meaning you may still have numbness in certain areas of the arm but can still be able to  move  parts of that arm, hand, and fingers. We recommend that your prescribed pain medications  be used as needed. We do not feel it is necessary to "pre medicate" and "stay ahead" of pain.  Taking narcotic pain medications when you are not having any pain can lead to unnecessary and potentially dangerous side effects.    9. Use the ice machine as much as possible in the first 5-7 days from surgery, then you can wean its use to as needed. The ice typically needs to be replaced every 6 hours, instead of ice you can actually freeze  water bottles to put in the cooler and then fill water around them to avoid having to purchase ice. You can have spare water bottles freezing to allow you to rotate them once they have melted. Try to have a thin shirt or light cloth or towel under the ice wrap to protect your skin.   FOR ADDITIONAL INFO ON ICE MACHINE AND INSTRUCTIONS GO TO THE WEBSITE AT  [email or web address]  10.  We recommend that you avoid any dental work or cleaning in the first 3 months following your joint replacement. This is to help minimize the possibility of infection from the bacteria in your mouth that enters your bloodstream during dental work. We also recommend that you take an antibiotic prior to your dental work for the first year after your shoulder replacement to further help reduce that risk. Please simply contact our office for antibiotics to be sent to your pharmacy prior to dental work.  11. Dental Antibiotics:  We recommend waiting at least 3 months for any dental work even cleanings unless there is a Actuary. We also recommend  prophylactic antibiotics for all dental procdeures  the first year following your joint replacement. In some exceptions we recommend them to be used lifelong. We will provide you with that prescription in follow up office visits, or you can call our office.  Exceptions are as follows:  1. History of prior total joint infection  2. Severely immunocompromised (Organ Transplant, cancer chemotherapy, Rheumatoid biologic meds such as Humera)  3. Poorly controlled diabetes (A1C &gt; 8.0, blood glucose over 200)   POST-OP EXERCISES  OK to allow arm to dangle by side gor hygiene and to move elbow wrist and hand

## 2023-03-11 NOTE — Op Note (Signed)
03/11/2023  11:14 AM  PATIENT:   Paul Ibarra  77 y.o. male  PRE-OPERATIVE DIAGNOSIS:  Left displaced proximal humerus fracture  POST-OPERATIVE DIAGNOSIS: Same  PROCEDURE: Left shoulder reverse arthroplasty utilizing a press-fit size 13 Arthrex stem with a neutral metathesis, +3 polyethylene insert, 39/+4 glenosphere and a small/+2 baseplate  SURGEON:  Temple Sporer, Vania Rea M.D.  ASSISTANTS: Ralene Bathe, PA-C  Ralene Bathe, PA-C was utilized as an Geophysicist/field seismologist throughout this case, essential for help with positioning the patient, positioning extremity, tissue manipulation, implantation of the prosthesis, suture management, wound closure, and intraoperative decision-making.  ANESTHESIA:   General endotracheal and interscalene block with Exparel  EBL: 200 cc  SPECIMEN: None  Drains: None   PATIENT DISPOSITION:  PACU - hemodynamically stable.    PLAN OF CARE: Discharge to home after PACU  Brief history:  Patient is a 77 year old male status post ground-level fall sustaining a significantly impacted and angulated left proximal humerus fracture.  Due to the degree of displacement and angulation patient has been counseled regarding treatment options and at this time is brought to the operating room for planned left shoulder reverse arthroplasty.  Preoperatively, I counseled the patient regarding treatment options and risks versus benefits thereof.  Possible surgical complications were all reviewed including potential for bleeding, infection, neurovascular injury, persistent pain, loss of motion, anesthetic complication, failure of the implant, and possible need for additional surgery. They understand and accept and agrees with our planned procedure.   Procedure in detail:  After undergoing routine preop evaluation the patient received prophylactic antibiotics and interscalene block with Exparel was was in the holding area by the anesthesia department.  Subsequently placed spine on  the operating table and underwent the smooth induction of a general endotracheal anesthesia.  Placed into the beachchair position and appropriately padded and protected.  The left shoulder girdle region was sterilely prepped and draped in standard fashion.  Timeout was called.  A deltopectoral approach left shoulder is made an approximately 10 cm incision.  Skin flaps elevated dissection carried deeply the deltopectoral interval was then developed from proximal to this with the vein taken laterally.  The conjoined tendon was mobilized and retracted medially.  The fracture site was identified and we used the bicipital groove as our landmark for separating the subscapularis which was elevated using electrocautery and tagged with a pair of grasping suture tape sutures.  We then tagged the supra and infraspinatus at the tendon bone junction with grasping suture tape sutures x 4 and we then used an osteotome to separate the articular segment from the greater tuberosity leaving a wafer of bone for later repair to the construct.  The articular segment was then removed and essentially a single piece with a number of small articular fragments removed piecemeal with a rondure then irrigated free.  We confirmed that the size of the tuberosity fragment was appropriate for ultimate repair and then exposed the glenoid using appropriate retractors.  A circumferential labral resection was then performed.  A guidepin was then directed into the center of the glenoid and the glenoid was then reamed with the central followed by the peripheral reamer to a stable [place] bony bed.  Preparation completed with a drill and tap for a 30 mm lag screw.  Our baseplate was then assembled and inserted with vancomycin powder applied to the threads of the lag screw and excellent purchase was achieved.  All of the peripheral locking screws were then placed using standard technique with excellent fixation.  A  39/+4 glenosphere was then impacted onto  the baseplate and the central locking screw was placed.  We then returned our attention back to the humeral canal which was widely open and ultimately broached up to a size 13 at approximately 20 degrees of retroversion.  A trial implant was placed and this showed good motion stability and soft tissue balance.  Trial was then removed.  Our final implant was then assembled the canal was irrigated cleaned and dried and the final size 13 implant was then impacted at approximate 20 degrees retroversion with excellent fixation achieved.  Based on the construct the +3 poly insert gave is the best motion stability and soft tissue balance.  A +3 poly was then impacted on the implant our final reduction was then performed we confirmed that there is good motion stability and soft tissue balance and the greater tuberosity nicely reduced about the implant.  I should mention that prior to the broaching we did place a fiber cerclage around the humeral metaphysis which was appropriately tightened and used to prevent any propagation of unicortical fractures down the humerus.  Once broaching was completed it was terminally tightened as we terminally impacted the final implant.  Once final reduction was then completed we confirmed that the tuberosities reduced appropriately and the soft tissue balance about the construct was much to our satisfaction.  We then used the suture limbs off the cerclage on the humeral metaphysis to act as a anchor point for suture limbs from both the subscapularis as well as the greater tuberosity and also sutured the limbs between the subscap and the greater tuberosity and the way that allowed excellent very apposition and closure of the soft tissue envelope about the prosthesis and nicely reduced the greater tuberosity fragment to the implant and the humeral metaphysis.  This point final irrigation was then completed.  Hemostasis was obtained.  The balance of the vancomycin powder was then sprayed  liberally throughout the deep soft tissue planes.  The deltopectoral interval was reapproximated with a series of figure-of-eight number Vicryl sutures.  2-0 Monocryl used to close the subcu layer and intracuticular 3-0 Monocryl used to close the skin followed by Dermabond and Aquacel dressing.  The left arm was then placed into a sling and the patient was awakened, extubated, and taken to the recovery room in stable condition.  Senaida Lange MD   Contact # [phone]

## 2023-03-11 NOTE — Anesthesia Procedure Notes (Signed)
Procedure Name: Intubation Date/Time: 03/11/2023 9:30 AM  Performed by: Cleda Clarks, CRNAPre-anesthesia Checklist: Patient identified, Emergency Drugs available, Suction available and Patient being monitored Patient Re-evaluated:Patient Re-evaluated prior to induction Oxygen Delivery Method: Circle system utilized Preoxygenation: Pre-oxygenation with 100% oxygen Induction Type: IV induction Ventilation: Mask ventilation without difficulty Laryngoscope Size: Miller and 2 Grade View: Grade II Tube type: Oral Tube size: 7.0 mm Number of attempts: 1 Airway Equipment and Method: Stylet Placement Confirmation: ETT inserted through vocal cords under direct vision, positive ETCO2 and breath sounds checked- equal and bilateral Secured at: 21 cm Tube secured with: Tape Dental Injury: Teeth and Oropharynx as per pre-operative assessment

## 2023-03-11 NOTE — Transfer of Care (Signed)
Immediate Anesthesia Transfer of Care Note  Patient: MAXIM SOSNOSKI  Procedure(s) Performed: REVERSE SHOULDER ARTHROPLASTY (Left: Shoulder)  Patient Location: PACU  Anesthesia Type:General and Regional  Level of Consciousness: drowsy  Airway & Oxygen Therapy: Patient Spontanous Breathing and Patient connected to nasal cannula oxygen  Post-op Assessment: Report given to RN and Post -op Vital signs reviewed and stable  Post vital signs: Reviewed and stable  Last Vitals:  Vitals Value Taken Time  BP    Temp    Pulse 83 03/11/23 1126  Resp    SpO2 95 % 03/11/23 1126  Vitals shown include unfiled device data.  Last Pain:  Vitals:   03/11/23 0852  PainSc: 0-No pain         Complications: No notable events documented.

## 2023-03-11 NOTE — Anesthesia Procedure Notes (Addendum)
Anesthesia Regional Block: Interscalene brachial plexus block   Pre-Anesthetic Checklist: , timeout performed,  Correct Patient, Correct Site, Correct Laterality,  Correct Procedure, Correct Position, site marked,  Risks and benefits discussed,  Pre-op evaluation,  At surgeon's request and post-op pain management  Laterality: Left  Prep: Maximum Sterile Barrier Precautions used, chloraprep       Needles:  Injection technique: Single-shot  Needle Type: Echogenic Stimulator Needle     Needle Length: 5cm  Needle Gauge: 22     Additional Needles:   Procedures:,,,, ultrasound used (permanent image in chart),,     Nerve Stimulator or Paresthesia:  Response: Biceps response  Additional Responses:   Narrative:  Start time: 03/11/2023 8:30 AM End time: 03/11/2023 8:35 AM Injection made incrementally with aspirations every 5 mL.  Performed by: Personally  Anesthesiologist: Gaynelle Adu, MD  Additional Notes: 2% Lidocaine skin wheel.

## 2023-03-11 NOTE — Anesthesia Preprocedure Evaluation (Addendum)
Anesthesia Evaluation  Patient identified by MRN, date of birth, ID band Patient awake    Reviewed: NPO status , Patient's Chart, lab work & pertinent test results, reviewed documented beta blocker date and time   History of Anesthesia Complications Negative for: history of anesthetic complications  Airway Mallampati: II  TM Distance: >3 FB     Dental  (+) Edentulous Upper, Edentulous Lower   Pulmonary COPD, former smoker   breath sounds clear to auscultation       Cardiovascular Exercise Tolerance: Good (-) hypertension(-) CAD, (-) Past MI, (-) Cardiac Stents and (-) CABG (-) Cardiac Defibrillator  Rhythm:Regular Rate:Normal     Neuro/Psych neg Seizures    GI/Hepatic ,,,(+) neg Cirrhosis        Endo/Other    Renal/GU Renal disease     Musculoskeletal   Abdominal   Peds  Hematology   Anesthesia Other Findings   Reproductive/Obstetrics                              Anesthesia Physical Anesthesia Plan  ASA: 2  Anesthesia Plan: General   Post-op Pain Management:    Induction: Intravenous  PONV Risk Score and Plan: 1 and Treatment may vary due to age or medical condition  Airway Management Planned: Oral ETT  Additional Equipment:   Intra-op Plan:   Post-operative Plan: Extubation in OR  Informed Consent: I have reviewed the patients History and Physical, chart, labs and discussed the procedure including the risks, benefits and alternatives for the proposed anesthesia with the patient or authorized representative who has indicated his/her understanding and acceptance.     Dental advisory given  Plan Discussed with: Surgeon and CRNA  Anesthesia Plan Comments:          Anesthesia Quick Evaluation

## 2023-03-12 ENCOUNTER — Encounter (HOSPITAL_COMMUNITY): Payer: Self-pay | Admitting: Orthopedic Surgery
